# Patient Record
Sex: Female | Born: 1955 | Race: White | Hispanic: No | Marital: Married | Smoking: Never smoker
Health system: Southern US, Community
[De-identification: ages and names within clinical notes are randomized; demographics above are authoritative.]

## PROBLEM LIST (undated history)

## (undated) DIAGNOSIS — Z8489 Family history of other specified conditions: Secondary | ICD-10-CM

## (undated) DIAGNOSIS — Z923 Personal history of irradiation: Secondary | ICD-10-CM

## (undated) DIAGNOSIS — C50919 Malignant neoplasm of unspecified site of unspecified female breast: Secondary | ICD-10-CM

## (undated) DIAGNOSIS — M1990 Unspecified osteoarthritis, unspecified site: Secondary | ICD-10-CM

## (undated) DIAGNOSIS — C439 Malignant melanoma of skin, unspecified: Secondary | ICD-10-CM

## (undated) DIAGNOSIS — E7800 Pure hypercholesterolemia, unspecified: Secondary | ICD-10-CM

## (undated) DIAGNOSIS — Z1371 Encounter for nonprocreative screening for genetic disease carrier status: Secondary | ICD-10-CM

## (undated) HISTORY — PX: BUNIONECTOMY: SHX129

## (undated) HISTORY — PX: LEG SKIN LESION  BIOPSY / EXCISION: SUR473

## (undated) HISTORY — DX: Encounter for nonprocreative screening for genetic disease carrier status: Z13.71

## (undated) HISTORY — PX: BACK SURGERY: SHX140

## (undated) HISTORY — DX: Malignant neoplasm of unspecified site of unspecified female breast: C50.919

---

## 1988-12-02 HISTORY — PX: ABDOMINAL HYSTERECTOMY: SHX81

## 2001-04-15 ENCOUNTER — Encounter: Payer: Self-pay | Admitting: Otolaryngology

## 2001-04-15 ENCOUNTER — Encounter: Admission: RE | Admit: 2001-04-15 | Discharge: 2001-04-15 | Payer: Self-pay | Admitting: Otolaryngology

## 2004-04-27 ENCOUNTER — Other Ambulatory Visit: Payer: Self-pay

## 2005-03-14 ENCOUNTER — Ambulatory Visit: Payer: Self-pay | Admitting: Internal Medicine

## 2006-04-10 ENCOUNTER — Ambulatory Visit: Payer: Self-pay | Admitting: Internal Medicine

## 2006-08-12 ENCOUNTER — Ambulatory Visit: Payer: Self-pay | Admitting: Specialist

## 2007-05-26 ENCOUNTER — Ambulatory Visit: Payer: Self-pay | Admitting: Internal Medicine

## 2007-07-03 ENCOUNTER — Ambulatory Visit: Payer: Self-pay | Admitting: Gastroenterology

## 2008-10-05 ENCOUNTER — Ambulatory Visit: Payer: Self-pay | Admitting: Internal Medicine

## 2009-11-15 ENCOUNTER — Ambulatory Visit: Payer: Self-pay | Admitting: Internal Medicine

## 2010-07-03 ENCOUNTER — Observation Stay: Payer: Self-pay | Admitting: Internal Medicine

## 2010-11-20 ENCOUNTER — Ambulatory Visit: Payer: Self-pay | Admitting: Internal Medicine

## 2010-12-02 HISTORY — PX: COLONOSCOPY: SHX174

## 2011-02-15 ENCOUNTER — Ambulatory Visit: Payer: Self-pay | Admitting: Internal Medicine

## 2012-02-20 ENCOUNTER — Ambulatory Visit: Payer: Self-pay | Admitting: Internal Medicine

## 2012-09-21 ENCOUNTER — Emergency Department: Payer: Self-pay | Admitting: Emergency Medicine

## 2012-09-21 LAB — COMPREHENSIVE METABOLIC PANEL
Albumin: 4.3 g/dL (ref 3.4–5.0)
Alkaline Phosphatase: 84 U/L (ref 50–136)
Anion Gap: 6 — ABNORMAL LOW (ref 7–16)
BUN: 17 mg/dL (ref 7–18)
Bilirubin,Total: 0.3 mg/dL (ref 0.2–1.0)
Calcium, Total: 8.8 mg/dL (ref 8.5–10.1)
Chloride: 106 mmol/L (ref 98–107)
Co2: 28 mmol/L (ref 21–32)
Creatinine: 0.73 mg/dL (ref 0.60–1.30)
EGFR (African American): >60
EGFR (Non-African Amer.): >60
Glucose: 85 mg/dL (ref 65–99)
Osmolality: 280 (ref 275–301)
Potassium: 3.7 mmol/L (ref 3.5–5.1)
SGOT(AST): 28 U/L (ref 15–37)
SGPT (ALT): 25 U/L (ref 12–78)
Sodium: 140 mmol/L (ref 136–145)
Total Protein: 7.4 g/dL (ref 6.4–8.2)

## 2012-09-21 LAB — CBC
HCT: 38.7 % (ref 35.0–47.0)
HGB: 13.2 g/dL (ref 12.0–16.0)
MCH: 29.8 pg (ref 26.0–34.0)
MCHC: 34.1 g/dL (ref 32.0–36.0)
MCV: 88 fL (ref 80–100)
Platelet: 296 10*3/uL (ref 150–440)
RBC: 4.43 10*6/uL (ref 3.80–5.20)
RDW: 13.4 % (ref 11.5–14.5)
WBC: 6.2 10*3/uL (ref 3.6–11.0)

## 2012-09-21 LAB — CK TOTAL AND CKMB (NOT AT ARMC)
CK, Total: 80 U/L (ref 21–215)
CK-MB: 0.9 ng/mL (ref 0.5–3.6)

## 2012-09-21 LAB — TROPONIN I: Troponin-I: <0.02 ng/mL

## 2012-09-21 LAB — PRO B NATRIURETIC PEPTIDE: B-Type Natriuretic Peptide: 135 pg/mL — ABNORMAL HIGH (ref 0–125)

## 2013-02-23 ENCOUNTER — Ambulatory Visit: Payer: Self-pay | Admitting: Internal Medicine

## 2013-05-24 ENCOUNTER — Ambulatory Visit: Payer: Self-pay | Admitting: Internal Medicine

## 2013-05-25 ENCOUNTER — Ambulatory Visit: Payer: Self-pay | Admitting: Internal Medicine

## 2014-02-28 ENCOUNTER — Ambulatory Visit: Payer: Self-pay | Admitting: Internal Medicine

## 2014-03-16 ENCOUNTER — Ambulatory Visit: Payer: Self-pay | Admitting: Orthopedic Surgery

## 2014-04-14 DIAGNOSIS — R509 Fever, unspecified: Secondary | ICD-10-CM | POA: Insufficient documentation

## 2014-05-31 ENCOUNTER — Ambulatory Visit: Payer: Self-pay | Admitting: Internal Medicine

## 2014-07-06 DIAGNOSIS — E785 Hyperlipidemia, unspecified: Secondary | ICD-10-CM | POA: Insufficient documentation

## 2014-07-06 DIAGNOSIS — N83209 Unspecified ovarian cyst, unspecified side: Secondary | ICD-10-CM | POA: Insufficient documentation

## 2014-07-12 ENCOUNTER — Ambulatory Visit: Payer: Self-pay | Admitting: Internal Medicine

## 2014-12-02 DIAGNOSIS — C50919 Malignant neoplasm of unspecified site of unspecified female breast: Secondary | ICD-10-CM

## 2014-12-02 DIAGNOSIS — Z1371 Encounter for nonprocreative screening for genetic disease carrier status: Secondary | ICD-10-CM

## 2014-12-02 HISTORY — DX: Encounter for nonprocreative screening for genetic disease carrier status: Z13.71

## 2014-12-02 HISTORY — DX: Malignant neoplasm of unspecified site of unspecified female breast: C50.919

## 2015-04-12 ENCOUNTER — Other Ambulatory Visit: Payer: Self-pay | Admitting: Internal Medicine

## 2015-04-12 DIAGNOSIS — Z1231 Encounter for screening mammogram for malignant neoplasm of breast: Secondary | ICD-10-CM

## 2015-06-06 ENCOUNTER — Ambulatory Visit
Admission: RE | Admit: 2015-06-06 | Discharge: 2015-06-06 | Disposition: A | Discharge status: Home / Self Care | Payer: 59 | Source: Ambulatory Visit | Attending: Internal Medicine | Admitting: Internal Medicine

## 2015-06-06 DIAGNOSIS — Z1231 Encounter for screening mammogram for malignant neoplasm of breast: Secondary | ICD-10-CM | POA: Diagnosis not present

## 2015-06-08 ENCOUNTER — Other Ambulatory Visit: Payer: Self-pay | Admitting: Internal Medicine

## 2015-06-08 DIAGNOSIS — R928 Other abnormal and inconclusive findings on diagnostic imaging of breast: Secondary | ICD-10-CM

## 2015-06-13 ENCOUNTER — Ambulatory Visit
Admission: RE | Admit: 2015-06-13 | Discharge: 2015-06-13 | Disposition: A | Discharge status: Home / Self Care | Payer: 59 | Source: Ambulatory Visit | Attending: Internal Medicine | Admitting: Internal Medicine

## 2015-06-13 DIAGNOSIS — R928 Other abnormal and inconclusive findings on diagnostic imaging of breast: Secondary | ICD-10-CM

## 2015-06-13 DIAGNOSIS — N6489 Other specified disorders of breast: Secondary | ICD-10-CM | POA: Diagnosis present

## 2015-06-13 DIAGNOSIS — N63 Unspecified lump in breast: Secondary | ICD-10-CM | POA: Insufficient documentation

## 2015-06-14 ENCOUNTER — Ambulatory Visit: Payer: 59

## 2015-06-14 ENCOUNTER — Other Ambulatory Visit: Payer: Self-pay | Admitting: Internal Medicine

## 2015-06-14 DIAGNOSIS — N63 Unspecified lump in unspecified breast: Secondary | ICD-10-CM

## 2015-06-14 DIAGNOSIS — R928 Other abnormal and inconclusive findings on diagnostic imaging of breast: Secondary | ICD-10-CM

## 2015-06-16 ENCOUNTER — Other Ambulatory Visit: Payer: Self-pay | Admitting: Internal Medicine

## 2015-06-16 ENCOUNTER — Ambulatory Visit
Admission: RE | Admit: 2015-06-16 | Discharge: 2015-06-16 | Disposition: A | Discharge status: Home / Self Care | Payer: 59 | Source: Ambulatory Visit | Attending: Internal Medicine | Admitting: Internal Medicine

## 2015-06-16 DIAGNOSIS — N63 Unspecified lump in unspecified breast: Secondary | ICD-10-CM

## 2015-06-16 DIAGNOSIS — N6092 Unspecified benign mammary dysplasia of left breast: Secondary | ICD-10-CM | POA: Diagnosis not present

## 2015-06-16 DIAGNOSIS — R928 Other abnormal and inconclusive findings on diagnostic imaging of breast: Secondary | ICD-10-CM

## 2015-06-16 DIAGNOSIS — D0502 Lobular carcinoma in situ of left breast: Secondary | ICD-10-CM | POA: Insufficient documentation

## 2015-06-16 DIAGNOSIS — D242 Benign neoplasm of left breast: Secondary | ICD-10-CM | POA: Insufficient documentation

## 2015-06-16 HISTORY — PX: BREAST BIOPSY: SHX20

## 2015-06-20 ENCOUNTER — Ambulatory Visit (INDEPENDENT_AMBULATORY_CARE_PROVIDER_SITE_OTHER): Payer: 59 | Admitting: General Surgery

## 2015-06-20 ENCOUNTER — Ambulatory Visit: Payer: Self-pay | Admitting: General Surgery

## 2015-06-20 ENCOUNTER — Encounter: Payer: Self-pay | Admitting: General Surgery

## 2015-06-20 VITALS — BP 116/68 | HR 80 | Resp 12 | Ht 60.0 in | Wt 121.0 lb

## 2015-06-20 DIAGNOSIS — C50912 Malignant neoplasm of unspecified site of left female breast: Secondary | ICD-10-CM

## 2015-06-20 DIAGNOSIS — N62 Hypertrophy of breast: Secondary | ICD-10-CM | POA: Diagnosis not present

## 2015-06-20 DIAGNOSIS — N6092 Unspecified benign mammary dysplasia of left breast: Secondary | ICD-10-CM

## 2015-06-20 LAB — SURGICAL PATHOLOGY

## 2015-06-20 NOTE — Progress Notes (Addendum)
Patient ID: Lauren Mayo, female   DOB: 12/18/1955, 59 y.o.   MRN: [id number]  Chief Complaint  Patient presents with  . Other    breast cancer    HPI Lauren Mayo is a 59 y.o. female who presents for a breast evaluation. The most recent mammogram was done on 06/06/15 and added views 06/13/15 and left breast biopsy on 06/16/15 .  Patient does not perform regular self breast checks and gets regular mammograms done.    HPI  Past Medical History  Diagnosis Date  . Cancer     melaoma   . Cancer of breast 2016    INVASIVE LOBULAR CARCINOMA    Past Surgical History  Procedure Laterality Date  . Abdominal hysterectomy  1990  . Breast biopsy Left     benign  . Breast biopsy Left 06/16/2015    pending results  . Leg skin lesion  biopsy / excision Left 2012,2015    melanoma  . Colonoscopy  2012    [facility]    Family History  Problem Relation Age of Onset  . Breast cancer Sister 7    2016  . Breast cancer Paternal Aunt 62  . Hypertension Mother   . Diabetes Father     Social History History  Substance Use Topics  . Smoking status: Never Smoker   . Smokeless tobacco: Not on file  . Alcohol Use: 0.0 oz/week    0 Standard drinks or equivalent per week    No Known Allergies  Current Outpatient Prescriptions  Medication Sig Dispense Refill  . ibuprofen (ADVIL,MOTRIN) 200 MG tablet Take 200 mg by mouth every 6 (six) hours as needed.    . Vitamin D, Ergocalciferol, (DRISDOL) 50000 UNITS CAPS capsule Take by mouth.     No current facility-administered medications for this visit.    Review of Systems Review of Systems  Constitutional: Negative.   Respiratory: Negative.   Cardiovascular: Negative.     Blood pressure 116/68, pulse 80, resp. rate 12, height 5' (1.524 m), weight 121 lb (54.885 kg).  Physical Exam Physical Exam  Constitutional: She is oriented to person, place, and time. She appears well-developed and well-nourished.  HENT:  Mouth/Throat:  Oropharynx is clear and moist. No oropharyngeal exudate.  Eyes: Conjunctivae are normal. No scleral icterus.  Neck: Neck supple.  Cardiovascular: Normal rate, regular rhythm and normal heart sounds.   Pulmonary/Chest: Effort normal and breath sounds normal. Right breast exhibits no inverted nipple, no mass, no nipple discharge, no skin change and no tenderness. Left breast exhibits mass ( ). Left breast exhibits no inverted nipple, no nipple discharge, no skin change and no tenderness.  Left  Breast bruisng at   Lymphadenopathy:    She has no cervical adenopathy.  Neurological: She is alert and oriented to person, place, and time.  Skin: Skin is warm and dry.    Data Reviewed Mammograms from 2015-2016 as well as associated ultrasounds were reviewed. New spiculated density identified on screening mammogram, persisted on focal spot compression imaging. Ultrasound showed an abnormality with subsequent identification of invasive lobular carcinoma at the 7:00 position. Fibroadenoma with atypical lobular hyperplasia at the 11:00 position.  Assessment    Invasive lobular carcinoma of the left breast.  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    Plan    We spent the better part of an hour reviewing treatment options. An MRIs felt indicated to help assess if additional areas of LCIS/invasive lobular carcinoma not identified  on mammography are present, especially in the contralateral breast. The patient is a of modest breast volume, "A" cup, may be a small "B" cup. Breast conservation as possible with no more extensive disease than that identified on the mammogram/ultrasound are present in the 7:00 position, and if no invasive diseases notified in the 11:00 position of the left breast.  Mastectomy and breast conservation were presented as equivalent procedures. The availability of breast reconstruction should mastectomy be needed or desired was reviewed. The  patient will consider whether she would like to me with the plastic surgeon prior to making a decision on management of the breast itself.  Hormone receptor status is pending at this time, and it's unlikely that this would be a HER-2 positive tumor and even if so she's well below the 1 cm cutoff for neo-adjuvant treatment with Herceptin.   The opportunity to meet with medical oncology prior to surgical intervention was discussed, but is unlikely to contribute to her surgical treatment decision and will be put on hold at this time. An informational brochure was provided.  The patient reports her slightly older sister had a 2 month window between her diagnosis and actual surgical management by wide excision and axillary biopsy. It was emphasized that the patient has plenty of time to get the information she needs to make a good decision, but once she decides on a treatment plan (not including the reconstruction)  surgery can be scheduled within a week.  The role of genetic testing in light of her family history for the possibility of BRCA 1/2 defects was discussed. The patient will consider whether she would like to proceed with genetic testing and return for sample collection with the nurse if desired. Testing would be appropriate in my view in light of her family history, and potential benefit of elective BSO if positive.    Patient to be scheduled to have a bilateral  MRI with / without.The patient was encouraged to call with any questions. She'll notify the office if she would like to meet with plastic surgery.   [name] 06/20/2015, 8:39 PM

## 2015-06-20 NOTE — Patient Instructions (Signed)
Patient to be scheduled to have a MRI.

## 2015-06-21 ENCOUNTER — Telehealth: Payer: Self-pay | Admitting: *Deleted

## 2015-06-21 ENCOUNTER — Encounter: Payer: Self-pay | Admitting: *Deleted

## 2015-06-21 NOTE — Telephone Encounter (Signed)
Patient wants to talk to you about genetic testing

## 2015-06-21 NOTE — Telephone Encounter (Signed)
Genetic testing scheduled.

## 2015-06-22 ENCOUNTER — Ambulatory Visit (INDEPENDENT_AMBULATORY_CARE_PROVIDER_SITE_OTHER): Payer: 59 | Admitting: *Deleted

## 2015-06-22 DIAGNOSIS — C50912 Malignant neoplasm of unspecified site of left female breast: Secondary | ICD-10-CM

## 2015-06-22 NOTE — Progress Notes (Signed)
Genetic testing completed, aware it may take 2 weeks. Her MRI breast is scheduled for next week 06-28-15.

## 2015-06-22 NOTE — Patient Instructions (Signed)
The patient is aware to call back for any questions or concerns.  

## 2015-06-26 ENCOUNTER — Other Ambulatory Visit: Payer: 59

## 2015-06-28 ENCOUNTER — Ambulatory Visit
Admission: RE | Admit: 2015-06-28 | Discharge: 2015-06-28 | Disposition: A | Discharge status: Home / Self Care | Payer: 59 | Source: Ambulatory Visit | Attending: General Surgery | Admitting: General Surgery

## 2015-06-28 DIAGNOSIS — C50912 Malignant neoplasm of unspecified site of left female breast: Secondary | ICD-10-CM

## 2015-06-28 MED ORDER — GADOBENATE DIMEGLUMINE 529 MG/ML IV SOLN
10.0000 mL | Freq: Once | INTRAVENOUS | Status: AC | PRN
  Administered 2015-06-28: 10 mL via INTRAVENOUS

## 2015-06-29 ENCOUNTER — Telehealth: Payer: Self-pay | Admitting: General Surgery

## 2015-06-29 NOTE — Telephone Encounter (Signed)
I reviewed the MRI results with the patient. The right breast is under remarkable. There is the known foci of invasive lobular carcinoma on the left and no increased activity in the area of ADH, also on the left.  The patient brought up the idea of bilateral mastectomy. The recent [facility] breast surgeon consensus statement is 13 no indication for prophylactic mastectomy.  Options for management include breast conservation or mastectomy with immediate reconstruction. At this time the patient is looking to avoid mastectomy and desires to proceed with lumpectomy/axillary dissection. She is aware that we will need to remove some additional tissue from the area of ADH.  Looking to schedule this the second week of August to accommodate her upcoming [place] trip. We'll arrange for an office visit prior to review these details.

## 2015-06-30 ENCOUNTER — Encounter: Payer: Self-pay | Admitting: General Surgery

## 2015-06-30 ENCOUNTER — Other Ambulatory Visit: Payer: Self-pay

## 2015-06-30 DIAGNOSIS — C50912 Malignant neoplasm of unspecified site of left female breast: Secondary | ICD-10-CM

## 2015-07-05 ENCOUNTER — Encounter: Payer: Self-pay | Admitting: *Deleted

## 2015-07-05 ENCOUNTER — Other Ambulatory Visit: Payer: Self-pay | Admitting: General Surgery

## 2015-07-05 ENCOUNTER — Inpatient Hospital Stay: Admission: RE | Admit: 2015-07-05 | Payer: 59 | Source: Ambulatory Visit

## 2015-07-05 ENCOUNTER — Ambulatory Visit (INDEPENDENT_AMBULATORY_CARE_PROVIDER_SITE_OTHER): Payer: 59 | Admitting: General Surgery

## 2015-07-05 ENCOUNTER — Encounter: Payer: Self-pay | Admitting: General Surgery

## 2015-07-05 ENCOUNTER — Ambulatory Visit: Payer: 59

## 2015-07-05 VITALS — BP 100/58 | HR 74 | Resp 12 | Ht 60.0 in | Wt 120.0 lb

## 2015-07-05 DIAGNOSIS — C50912 Malignant neoplasm of unspecified site of left female breast: Secondary | ICD-10-CM

## 2015-07-05 DIAGNOSIS — N62 Hypertrophy of breast: Secondary | ICD-10-CM | POA: Diagnosis not present

## 2015-07-05 DIAGNOSIS — N6092 Unspecified benign mammary dysplasia of left breast: Secondary | ICD-10-CM

## 2015-07-05 NOTE — H&P (Signed)
Patient ID: Lauren Mayo, female DOB: 16-Oct-1956, 59 y.o. MRN: [id number]  Chief Complaint  Patient presents with  . Pre-op Exam    left breast wide excision    HPI Lauren Mayo is a 59 y.o. female here today for her pre op left breast wide excision scheduled for 07/12/15 for invasive lobular carcinoma. MRI breast was done on 06-28-15. Genetic testing has been completed, results pending.  HPI  Past Medical History  Diagnosis Date  . Cancer     melaoma   . Cancer of breast 2016    INVASIVE LOBULAR CARCINOMA    Past Surgical History  Procedure Laterality Date  . Abdominal hysterectomy  1990  . Leg skin lesion biopsy / excision Left 2012,2015    melanoma  . Colonoscopy  2012    [facility]  . Breast biopsy Left     benign  . Breast biopsy Left 06/16/2015    INVASIVE LOBULAR CARCINOMA    Family History  Problem Relation Age of Onset  . Breast cancer Sister 62    2016  . Breast cancer Paternal Aunt 69  . Hypertension Mother   . Diabetes Father   . Cancer Maternal Aunt 63    ovarian    Social History History  Substance Use Topics  . Smoking status: Never Smoker   . Smokeless tobacco: Not on file  . Alcohol Use: 0.0 oz/week    0 Standard drinks or equivalent per week    No Known Allergies  Current Outpatient Prescriptions  Medication Sig Dispense Refill  . ibuprofen (ADVIL,MOTRIN) 200 MG tablet Take 200 mg by mouth every 6 (six) hours as needed.    . Vitamin D, Ergocalciferol, (DRISDOL) 50000 UNITS CAPS capsule Take by mouth.     No current facility-administered medications for this visit.    Review of Systems Review of Systems  Constitutional: Negative.  Respiratory: Negative.  Cardiovascular: Negative.    Blood pressure 100/58, pulse 74, resp. rate 12, height 5' (1.524 m), weight 120 lb (54.432 kg).  Physical  Exam Physical Exam  Constitutional: She is oriented to person, place, and time. She appears well-developed and well-nourished.  HENT:  Mouth/Throat: Oropharynx is clear and moist.  Eyes: Conjunctivae are normal. No scleral icterus.  Neck: Neck supple.  Cardiovascular: Normal rate, regular rhythm and normal heart sounds.  Pulmonary/Chest: Effort normal and breath sounds normal.    Lymphadenopathy:   She has no cervical adenopathy.  Neurological: She is alert and oriented to person, place, and time.  Skin: Skin is warm and dry.  Psychiatric: She has a normal mood and affect.    Data Reviewed Ultrasound examination of the left breast was undertaken to confirm both lesion would be visible for wide excision.  In the 8:00 position (arm over the shoulder), 4 cm from the nipple the area of previous biopsy measuring 0.4 x 0.5 x 0.6 cm is identified.  At the 11:00 position, 2 cm from the nipple the previous biopsy site measuring up to 0.7 cm in diameter is visible. BI-RADS-6.  Previous MRI has been reviewed. Only the area of known invasive lobular carcinoma shows increased activity.    Assessment    Invasive lobular carcinoma of the lower inner quadrant of the left breast. 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  Desire for breast conservation.    Plan    Options for management of cancer were reviewed including mastectomy with or without reconstruction as well as partial mastectomy followed by radiation therapy. The  possibility of positive margins and the need for reexcision were reviewed.  Genetic testing is pending but would not change the patient's option for breast conservation which is her present desire.  Plans are to proceed with wide excision and sentinel node biopsy on Wednesday, 07/12/2015.     PCP: Teena Irani 07/05/2015, 9:25 AM

## 2015-07-05 NOTE — Patient Instructions (Signed)
The patient is aware to call back for any questions or concerns.  

## 2015-07-05 NOTE — Patient Instructions (Signed)
  Your procedure is scheduled on: 07-12-15 Report to [facility] (2ND DESK ON RIGHT)   Remember: Instructions that are not followed completely may result in serious medical risk, up to and including death, or upon the discretion of your surgeon and anesthesiologist your surgery may need to be rescheduled.    _X___ 1. Do not eat food or drink liquids after midnight. No gum chewing or hard candies.     _X___ 2. No Alcohol for 24 hours before or after surgery.   ____ 3. Bring all medications with you on the day of surgery if instructed.    ____ 4. Notify your doctor if there is any change in your medical condition     (cold, fever, infections).     Do not wear jewelry, make-up, hairpins, clips or nail polish.  Do not wear lotions, powders, or perfumes. You may wear deodorant.  Do not shave 48 hours prior to surgery. Men may shave face and neck.  Do not bring valuables to the hospital.    [facility] is not responsible for any belongings or valuables.               Contacts, dentures or bridgework may not be worn into surgery.  Leave your suitcase in the car. After surgery it may be brought to your room.  For patients admitted to the hospital, discharge time is determined by your   treatment team.   Patients discharged the day of surgery will not be allowed to drive home.   Please read over the following fact sheets that you were given:     ____ Take these medicines the morning of surgery with A SIP OF WATER:    1. NONE  2.   3.   4.  5.  6.  ____ Fleet Enema (as directed)   ____ Use CHG Soap as directed  ____ Use inhalers on the day of surgery  ____ Stop metformin 2 days prior to surgery    ____ Take 1/2 of usual insulin dose the night before surgery and none on the morning of surgery.   __X__ Stop Coumadin/Plavix/aspirin-STOP ASA NOW  __X__ Stop Anti-inflammatories-STOP IBUPROFEN NOW-NO NSAIDS OR ASA PRODUCTS-TYLENOL OK   ____ Stop supplements until  after surgery.    ____ Bring C-Pap to the hospital.

## 2015-07-05 NOTE — Progress Notes (Signed)
Patient ID: Lauren Mayo, female   DOB: 1956-10-31, 59 y.o.   MRN: [id number]  Chief Complaint  Patient presents with  . Pre-op Exam    left breast wide excision    HPI Lauren Mayo is a 59 y.o. female here today for her pre op left breast wide excision scheduled for 07/12/15 for invasive lobular carcinoma. MRI breast was done on 06-28-15. Genetic testing has been completed, results pending.   HPI  Past Medical History  Diagnosis Date  . Cancer     melaoma   . Cancer of breast 2016    INVASIVE LOBULAR CARCINOMA    Past Surgical History  Procedure Laterality Date  . Abdominal hysterectomy  1990  . Leg skin lesion  biopsy / excision Left 2012,2015    melanoma  . Colonoscopy  2012    [facility]  . Breast biopsy Left     benign  . Breast biopsy Left 06/16/2015    INVASIVE LOBULAR CARCINOMA    Family History  Problem Relation Age of Onset  . Breast cancer Sister 83    2016  . Breast cancer Paternal Aunt 17  . Hypertension Mother   . Diabetes Father   . Cancer Maternal Aunt 63    ovarian    Social History History  Substance Use Topics  . Smoking status: Never Smoker   . Smokeless tobacco: Not on file  . Alcohol Use: 0.0 oz/week    0 Standard drinks or equivalent per week    No Known Allergies  Current Outpatient Prescriptions  Medication Sig Dispense Refill  . ibuprofen (ADVIL,MOTRIN) 200 MG tablet Take 200 mg by mouth every 6 (six) hours as needed.    . Vitamin D, Ergocalciferol, (DRISDOL) 50000 UNITS CAPS capsule Take by mouth.     No current facility-administered medications for this visit.    Review of Systems Review of Systems  Constitutional: Negative.   Respiratory: Negative.   Cardiovascular: Negative.     Blood pressure 100/58, pulse 74, resp. rate 12, height 5' (1.524 m), weight 120 lb (54.432 kg).  Physical Exam Physical Exam  Constitutional: She is oriented to person, place, and time. She appears well-developed and well-nourished.   HENT:  Mouth/Throat: Oropharynx is clear and moist.  Eyes: Conjunctivae are normal. No scleral icterus.  Neck: Neck supple.  Cardiovascular: Normal rate, regular rhythm and normal heart sounds.   Pulmonary/Chest: Effort normal and breath sounds normal.    Lymphadenopathy:    She has no cervical adenopathy.  Neurological: She is alert and oriented to person, place, and time.  Skin: Skin is warm and dry.  Psychiatric: She has a normal mood and affect.    Data Reviewed Ultrasound examination of the left breast was undertaken to confirm both lesion would be visible for wide excision.  In the 8:00 position (arm over the shoulder), 4 cm from the nipple the area of previous biopsy measuring 0.4 x 0.5 x 0.6 cm is identified.  At the 11:00 position, 2 cm from the nipple the previous biopsy site measuring up to 0.7 cm in diameter is visible. BI-RADS-6.  Previous MRI has been reviewed. Only the area of known invasive lobular carcinoma shows increased activity.    Assessment    Invasive lobular carcinoma of the lower inner quadrant of the left breast. 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  Desire for breast conservation.    Plan    Options for management of cancer were reviewed including mastectomy with or without  reconstruction as well as partial mastectomy followed by radiation therapy. The possibility of positive margins and the need for reexcision were reviewed.  Genetic testing is pending but would not change the patient's option for breast conservation which is her present desire.  Plans are to proceed with wide excision and sentinel node biopsy on Wednesday, 07/12/2015.     PCP:  Teena Irani 07/05/2015, 9:25 AM

## 2015-07-10 ENCOUNTER — Telehealth: Payer: Self-pay | Admitting: *Deleted

## 2015-07-10 NOTE — Telephone Encounter (Signed)
Patient is calling wanting BRCA results.

## 2015-07-11 ENCOUNTER — Encounter: Payer: Self-pay | Admitting: *Deleted

## 2015-07-11 NOTE — Telephone Encounter (Signed)
Notified of BRCA negative results, pt pleased. Surgery is for tomorrow.

## 2015-07-12 ENCOUNTER — Ambulatory Visit: Payer: 59 | Admitting: Certified Registered Nurse Anesthetist

## 2015-07-12 ENCOUNTER — Ambulatory Visit
Admission: RE | Admit: 2015-07-12 | Discharge: 2015-07-12 | Disposition: A | Discharge status: Home / Self Care | Payer: 59 | Source: Ambulatory Visit | Attending: General Surgery | Admitting: General Surgery

## 2015-07-12 ENCOUNTER — Ambulatory Visit: Payer: 59

## 2015-07-12 ENCOUNTER — Encounter
Admission: RE | Disposition: A | Discharge status: Home / Self Care | Payer: Self-pay | Source: Ambulatory Visit | Attending: General Surgery

## 2015-07-12 DIAGNOSIS — D0502 Lobular carcinoma in situ of left breast: Secondary | ICD-10-CM | POA: Insufficient documentation

## 2015-07-12 DIAGNOSIS — Z833 Family history of diabetes mellitus: Secondary | ICD-10-CM | POA: Diagnosis not present

## 2015-07-12 DIAGNOSIS — C50912 Malignant neoplasm of unspecified site of left female breast: Secondary | ICD-10-CM

## 2015-07-12 DIAGNOSIS — D242 Benign neoplasm of left breast: Secondary | ICD-10-CM | POA: Insufficient documentation

## 2015-07-12 DIAGNOSIS — N63 Unspecified lump in unspecified breast: Secondary | ICD-10-CM

## 2015-07-12 DIAGNOSIS — Z8041 Family history of malignant neoplasm of ovary: Secondary | ICD-10-CM | POA: Diagnosis not present

## 2015-07-12 DIAGNOSIS — Z803 Family history of malignant neoplasm of breast: Secondary | ICD-10-CM | POA: Diagnosis not present

## 2015-07-12 DIAGNOSIS — Z9889 Other specified postprocedural states: Secondary | ICD-10-CM | POA: Insufficient documentation

## 2015-07-12 DIAGNOSIS — M1990 Unspecified osteoarthritis, unspecified site: Secondary | ICD-10-CM | POA: Diagnosis not present

## 2015-07-12 DIAGNOSIS — Z85820 Personal history of malignant melanoma of skin: Secondary | ICD-10-CM | POA: Insufficient documentation

## 2015-07-12 DIAGNOSIS — Z8249 Family history of ischemic heart disease and other diseases of the circulatory system: Secondary | ICD-10-CM | POA: Insufficient documentation

## 2015-07-12 DIAGNOSIS — Z90710 Acquired absence of both cervix and uterus: Secondary | ICD-10-CM | POA: Insufficient documentation

## 2015-07-12 DIAGNOSIS — C50312 Malignant neoplasm of lower-inner quadrant of left female breast: Secondary | ICD-10-CM | POA: Diagnosis not present

## 2015-07-12 DIAGNOSIS — Z791 Long term (current) use of non-steroidal anti-inflammatories (NSAID): Secondary | ICD-10-CM | POA: Diagnosis not present

## 2015-07-12 HISTORY — DX: Unspecified osteoarthritis, unspecified site: M19.90

## 2015-07-12 HISTORY — PX: BREAST LUMPECTOMY WITH SENTINEL LYMPH NODE BIOPSY: SHX5597

## 2015-07-12 HISTORY — PX: BREAST LUMPECTOMY: SHX2

## 2015-07-12 HISTORY — DX: Malignant melanoma of skin, unspecified: C43.9

## 2015-07-12 HISTORY — DX: Family history of other specified conditions: Z84.89

## 2015-07-12 HISTORY — DX: Pure hypercholesterolemia, unspecified: E78.00

## 2015-07-12 SURGERY — BREAST LUMPECTOMY WITH SENTINEL LYMPH NODE BX
Anesthesia: General | Laterality: Left | Wound class: Clean

## 2015-07-12 MED ORDER — LACTATED RINGERS IV SOLN
INTRAVENOUS | Status: DC
  Administered 2015-07-12: 15:00:00 via INTRAVENOUS

## 2015-07-12 MED ORDER — METHYLENE BLUE 1 % INJ SOLN
INTRAMUSCULAR | Status: AC
  Filled 2015-07-12: qty 10

## 2015-07-12 MED ORDER — METHYLENE BLUE 1 % INJ SOLN
INTRAMUSCULAR | Status: DC | PRN
  Administered 2015-07-12: 1 mL

## 2015-07-12 MED ORDER — HYDROCODONE-ACETAMINOPHEN 5-325 MG PO TABS: 1.0000 | ORAL_TABLET | ORAL | Status: DC | PRN

## 2015-07-12 MED ORDER — BUPIVACAINE-EPINEPHRINE (PF) 0.5% -1:200000 IJ SOLN
INTRAMUSCULAR | Status: AC
  Filled 2015-07-12: qty 30

## 2015-07-12 MED ORDER — ONDANSETRON HCL 4 MG/2ML IJ SOLN
INTRAMUSCULAR | Status: DC | PRN
  Administered 2015-07-12: 4 mg via INTRAVENOUS

## 2015-07-12 MED ORDER — LIDOCAINE HCL (CARDIAC) 20 MG/ML IV SOLN
INTRAVENOUS | Status: DC | PRN
  Administered 2015-07-12: 60 mg via INTRAVENOUS

## 2015-07-12 MED ORDER — TECHNETIUM TC 99M SULFUR COLLOID
0.9960 | Freq: Once | INTRAVENOUS | Status: DC | PRN
  Administered 2015-07-12: 0.996 via INTRAVENOUS
  Filled 2015-07-12: qty 1

## 2015-07-12 MED ORDER — FAMOTIDINE 20 MG PO TABS
ORAL_TABLET | ORAL | Status: AC
  Administered 2015-07-12: 20 mg via ORAL
  Filled 2015-07-12: qty 1

## 2015-07-12 MED ORDER — SODIUM CHLORIDE 0.9 % IJ SOLN
INTRAMUSCULAR | Status: AC
  Filled 2015-07-12: qty 10

## 2015-07-12 MED ORDER — BUPIVACAINE-EPINEPHRINE (PF) 0.5% -1:200000 IJ SOLN
INTRAMUSCULAR | Status: DC | PRN
  Administered 2015-07-12: 30 mL

## 2015-07-12 MED ORDER — EPHEDRINE SULFATE 50 MG/ML IJ SOLN
INTRAMUSCULAR | Status: DC | PRN
  Administered 2015-07-12: 10 mg via INTRAVENOUS

## 2015-07-12 MED ORDER — FENTANYL CITRATE (PF) 100 MCG/2ML IJ SOLN
INTRAMUSCULAR | Status: AC
  Administered 2015-07-12: 25 ug via INTRAVENOUS
  Filled 2015-07-12: qty 2

## 2015-07-12 MED ORDER — KETOROLAC TROMETHAMINE 30 MG/ML IJ SOLN
INTRAMUSCULAR | Status: DC | PRN
  Administered 2015-07-12: 30 mg via INTRAVENOUS

## 2015-07-12 MED ORDER — FENTANYL CITRATE (PF) 100 MCG/2ML IJ SOLN
INTRAMUSCULAR | Status: DC | PRN
  Administered 2015-07-12: 25 ug via INTRAVENOUS
  Administered 2015-07-12 (×3): 50 ug via INTRAVENOUS
  Administered 2015-07-12: 25 ug via INTRAVENOUS

## 2015-07-12 MED ORDER — FENTANYL CITRATE (PF) 100 MCG/2ML IJ SOLN
25.0000 ug | INTRAMUSCULAR | Status: DC | PRN
  Administered 2015-07-12 (×4): 25 ug via INTRAVENOUS

## 2015-07-12 MED ORDER — ONDANSETRON HCL 4 MG/2ML IJ SOLN: 4.0000 mg | Freq: Once | INTRAMUSCULAR | Status: DC | PRN

## 2015-07-12 MED ORDER — PROPOFOL 10 MG/ML IV BOLUS
INTRAVENOUS | Status: DC | PRN
  Administered 2015-07-12: 130 mg via INTRAVENOUS

## 2015-07-12 MED ORDER — FAMOTIDINE 20 MG PO TABS
20.0000 mg | ORAL_TABLET | Freq: Once | ORAL | Status: AC
  Administered 2015-07-12: 20 mg via ORAL

## 2015-07-12 MED ORDER — MIDAZOLAM HCL 2 MG/2ML IJ SOLN
INTRAMUSCULAR | Status: DC | PRN
  Administered 2015-07-12: 2 mg via INTRAVENOUS

## 2015-07-12 MED ORDER — DEXAMETHASONE SODIUM PHOSPHATE 4 MG/ML IJ SOLN
INTRAMUSCULAR | Status: DC | PRN
  Administered 2015-07-12: 10 mg via INTRAVENOUS

## 2015-07-12 SURGICAL SUPPLY — 50 items
BANDAGE ELASTIC 6 CLIP NS LF (GAUZE/BANDAGES/DRESSINGS) ×2 IMPLANT
BLADE SURG 15 STRL SS SAFETY (BLADE) ×4 IMPLANT
BNDG GAUZE 4.5X4.1 6PLY STRL (MISCELLANEOUS) ×2 IMPLANT
BULB RESERV EVAC DRAIN JP 100C (MISCELLANEOUS) IMPLANT
CANISTER SUCT 1200ML W/VALVE (MISCELLANEOUS) ×2 IMPLANT
CHLORAPREP W/TINT 26ML (MISCELLANEOUS) ×2 IMPLANT
CNTNR SPEC 2.5X3XGRAD LEK (MISCELLANEOUS) ×1
CONT SPEC 4OZ STER OR WHT (MISCELLANEOUS) ×1
CONT SPEC 4OZ STRL OR WHT (MISCELLANEOUS) ×1
CONTAINER SPEC 2.5X3XGRAD LEK (MISCELLANEOUS) ×1 IMPLANT
COVER PROBE FLX POLY STRL (MISCELLANEOUS) ×2 IMPLANT
DEVICE DUBIN SPECIMEN MAMMOGRA (MISCELLANEOUS) ×2 IMPLANT
DRAIN CHANNEL JP 15F RND 16 (MISCELLANEOUS) IMPLANT
DRAPE LAPAROTOMY TRNSV 106X77 (MISCELLANEOUS) ×2 IMPLANT
DRSG TELFA 3X8 NADH (GAUZE/BANDAGES/DRESSINGS) ×2 IMPLANT
ELECT CAUTERY BLADE TIP 2.5 (TIP) ×2
ELECTRODE CAUTERY BLDE TIP 2.5 (TIP) ×1 IMPLANT
GAUZE FLUFF 18X24 1PLY STRL (GAUZE/BANDAGES/DRESSINGS) ×2 IMPLANT
GAUZE SPONGE 4X4 12PLY STRL (GAUZE/BANDAGES/DRESSINGS) ×2 IMPLANT
GLOVE BIO SURGEON STRL SZ7.5 (GLOVE) ×4 IMPLANT
GLOVE INDICATOR 8.0 STRL GRN (GLOVE) ×4 IMPLANT
GOWN STRL REUS W/ TWL LRG LVL3 (GOWN DISPOSABLE) ×2 IMPLANT
GOWN STRL REUS W/TWL LRG LVL3 (GOWN DISPOSABLE) ×4
HARMONIC SCALPEL FOCUS (MISCELLANEOUS) IMPLANT
KIT RM TURNOVER STRD PROC AR (KITS) ×2 IMPLANT
LABEL OR SOLS (LABEL) ×2 IMPLANT
MARGIN MAP 10MM (MISCELLANEOUS) ×2 IMPLANT
NDL SAFETY 22GX1.5 (NEEDLE) ×2 IMPLANT
NDL SAFETY 25GX1.5 (NEEDLE) ×2 IMPLANT
PACK BASIN MINOR ARMC (MISCELLANEOUS) ×2 IMPLANT
PAD GROUND ADULT SPLIT (MISCELLANEOUS) ×2 IMPLANT
SHEARS FOC LG CVD HARMONIC 17C (MISCELLANEOUS) IMPLANT
SLEVE PROBE SENORX GAMMA FIND (MISCELLANEOUS) ×2 IMPLANT
STRIP CLOSURE SKIN 1/2X4 (GAUZE/BANDAGES/DRESSINGS) ×2 IMPLANT
SUT ETHILON 3-0 FS-10 30 BLK (SUTURE) ×2
SUT SILK 2 0 (SUTURE) ×2
SUT SILK 2-0 18XBRD TIE 12 (SUTURE) ×1 IMPLANT
SUT VIC AB 2-0 CT1 27 (SUTURE) ×4
SUT VIC AB 2-0 CT1 TAPERPNT 27 (SUTURE) ×2 IMPLANT
SUT VIC AB 3-0 SH 27 (SUTURE) ×4
SUT VIC AB 3-0 SH 27X BRD (SUTURE) ×2 IMPLANT
SUT VIC AB 4-0 FS2 27 (SUTURE) ×2 IMPLANT
SUT VICRYL+ 3-0 144IN (SUTURE) ×2 IMPLANT
SUTURE EHLN 3-0 FS-10 30 BLK (SUTURE) ×1 IMPLANT
SWABSTK COMLB BENZOIN TINCTURE (MISCELLANEOUS) ×2 IMPLANT
SYR BULB IRRIG 60ML STRL (SYRINGE) ×2 IMPLANT
SYR CONTROL 10ML (SYRINGE) ×2 IMPLANT
SYRINGE 10CC LL (SYRINGE) ×2 IMPLANT
TAPE TRANSPORE STRL 2 31045 (GAUZE/BANDAGES/DRESSINGS) IMPLANT
WATER STERILE IRR 1000ML POUR (IV SOLUTION) ×2 IMPLANT

## 2015-07-12 NOTE — Anesthesia Preprocedure Evaluation (Signed)
Anesthesia Evaluation  Patient identified by MRN, date of birth, ID band Patient awake    Reviewed: Allergy & Precautions, NPO status , Patient's Chart, lab work & pertinent test results  History of Anesthesia Complications (+) Family history of anesthesia reactionNegative for: history of anesthetic complications (sister with difficulty waking up)  Airway Mallampati: II  TM Distance: >3 FB Neck ROM: Full    Dental  (+) Teeth Intact   Pulmonary neg pulmonary ROS,          Cardiovascular     Neuro/Psych negative neurological ROS     GI/Hepatic   Endo/Other    Renal/GU      Musculoskeletal  (+) Arthritis -, Osteoarthritis,    Abdominal   Peds  Hematology   Anesthesia Other Findings   Reproductive/Obstetrics                             Anesthesia Physical Anesthesia Plan  ASA: II  Anesthesia Plan: General   Post-op Pain Management:    Induction: Intravenous  Airway Management Planned: LMA  Additional Equipment:   Intra-op Plan:   Post-operative Plan:   Informed Consent: I have reviewed the patients History and Physical, chart, labs and discussed the procedure including the risks, benefits and alternatives for the proposed anesthesia with the patient or authorized representative who has indicated his/her understanding and acceptance.     Plan Discussed with:   Anesthesia Plan Comments:         Anesthesia Quick Evaluation

## 2015-07-12 NOTE — Anesthesia Postprocedure Evaluation (Signed)
  Anesthesia Post-op Note  Patient: Lauren Mayo  Procedure(s) Performed: Procedure(s): BREAST WIDE EXCISION WITH SENTINEL LYMPH NODE BX, MASTOPLASTY  (Left)  Anesthesia type:General  Patient location: PACU  Post pain: Pain level controlled  Post assessment: Post-op Vital signs reviewed, Patient's Cardiovascular Status Stable, Respiratory Function Stable, Patent Airway and No signs of Nausea or vomiting  Post vital signs: Reviewed and stable  Last Vitals:  Filed Vitals:   07/12/15 1655  BP: 122/66  Pulse: 93  Temp:   Resp: 20    Level of consciousness: awake, alert  and patient cooperative  Complications: No apparent anesthesia complications

## 2015-07-12 NOTE — Transfer of Care (Signed)
Immediate Anesthesia Transfer of Care Note  Patient: Lauren Mayo  Procedure(s) Performed: Procedure(s): BREAST WIDE EXCISION WITH SENTINEL LYMPH NODE BX, MASTOPLASTY  (Left)  Patient Location: PACU  Anesthesia Type:General  Level of Consciousness: sedated, patient cooperative and responds to stimulation  Airway & Oxygen Therapy: Patient Spontanous Breathing and Patient connected to face mask oxygen  Post-op Assessment: Report given to RN and Post -op Vital signs reviewed and stable  Post vital signs: Reviewed and stable  Last Vitals:  Filed Vitals:   07/12/15 1640  BP: 110/55  Pulse: 72  Temp: 36.2 C  Resp: 19    Complications: No apparent anesthesia complications

## 2015-07-12 NOTE — H&P (Signed)
59 year old female identified with invasive lobular carcinoma. For wide local excision as well as excision of a small area of LCIS. Sentinel node biopsy is planned.  No change in health her cardiovascular status since last visit.

## 2015-07-12 NOTE — Anesthesia Procedure Notes (Signed)
Procedure Name: LMA Insertion Date/Time: 07/12/2015 3:02 PM Performed by: Jonna Clark Pre-anesthesia Checklist: Patient identified, Patient being monitored, Timeout performed, Emergency Drugs available and Suction available Patient Re-evaluated:Patient Re-evaluated prior to inductionOxygen Delivery Method: Circle system utilized Preoxygenation: Pre-oxygenation with 100% oxygen Intubation Type: IV induction Ventilation: Mask ventilation without difficulty LMA: LMA inserted LMA Size: 3.0 Tube type: Oral Number of attempts: 1 Placement Confirmation: positive ETCO2 and breath sounds checked- equal and bilateral Tube secured with: Tape Dental Injury: Teeth and Oropharynx as per pre-operative assessment

## 2015-07-12 NOTE — Discharge Instructions (Signed)
AMBULATORY SURGERY  °DISCHARGE INSTRUCTIONS ° ° °1) The drugs that you were given will stay in your system until tomorrow so for the next 24 hours you should not: ° °A) Drive an automobile °B) Make any legal decisions °C) Drink any alcoholic beverage ° ° °2) You may resume regular meals tomorrow.  Today it is better to start with liquids and gradually work up to solid foods. ° °You may eat anything you prefer, but it is better to start with liquids, then soup and crackers, and gradually work up to solid foods. ° ° °3) Please notify your doctor immediately if you have any unusual bleeding, trouble breathing, redness and pain at the surgery site, drainage, fever, or pain not relieved by medication. ° °Please contact your physician with any problems or Same Day Surgery at [phone], Monday through Friday 6 am to 4 pm, or [place] [facility] [place] [facility] number at [phone]. °

## 2015-07-12 NOTE — Op Note (Signed)
Preoperative diagnosis: Invasive lobular carcinoma the left breast, LCIS.  Postoperative diagnosis: Same.  Operative procedure: Wide excision and mastoplasty left lower inner quadrant, excision of left upper quadrant area of LCIS, sentinel node biopsy.  Operative surgeon: Ollen Bowl, M.D.  Anesthesia: Gen. by LMA, Marcaine 0.5% with 1-200,000 units of epinephrine: 30 mL  Estimated blood loss: Less than 10 mL.  Clinical note: This 59 year old woman was noted to have an abnormal mammogram and biopsy showed an area of invasive lobular carcinoma in the lower inner quadrant of the left breast. A fibroadenoma with focal LCIS was identified in the upper inner quadrant of the left breast. Preoperative MRI showed no additional lesions and no evidence of increased activity at the site of LCIS. She underwent injection with technetium sulfur colloid prior to today's procedure for planned sentinel node biopsy.  Operative note: With the patient under adequate general anesthesia 3 mL of saline/methylene blue diluted to 1 was injected in the subareolar plexus. The breast was then prepped with chlor prep and draped. Ultrasound was used to identify location sites of the 2 previous biopsies. Attention was turned to the lower inner quadrant were radial incision was made from the edge of the areola and extended not to the inframammary fold. The skin was incised sharply in the adipose tissue divided. The 2 x 2 x 2 cm block of tissue was excised down to and including the pectoralis fascia. The specimen was orientated and specimen radiograph confirmed the previously placed clip was in place. Pathology report showed the inferior margin to be close but negative.  Attention was turned towards the upper inner quadrant lesion which rested just above the pectoralis fascia. To avoid a second incision, ultrasound is used to direct a hemostat below the lesion. The area was then dissected free from the wide excision site  orientated and specimen radiograph confirmed the previously placed clip in place. This sample was sent for permanent section. While the pathology reports were pending attention was turned to the left axilla.  The gamma finder showed an area of increased uptake at the base of the left axilla. The area was infiltrated with Marcaine as had been the primary breast incisions for postoperative comfort. The skin was incised sharply and the remaining dissection completed with electrocautery. A lymphatic was followed to a blue hot lymph node. 2 additional non-blue, hot lymph node and one non-blue, non-hot non-sentinel node were removed and sent for permanent section. Preoperative discussion with pathology suggested that the likelihood of being able to detect isolated metastatic invasive lobular carcinoma in a field of lymphocytes was unlikely. As the primary tumor was very small axillary metastatic disease is felt to be unlikely.  Hemostasis was electrocautery and the axilla wound was closed in layers with interrupted 2-0 Vicryls figure-of-eight sutures. The skin was closed with a running 4-0 running subcuticular suture.  The breast was elevated off the underlying pectoralis fascia taking the fascia with the breast specimen circumferentially for approximately 3 cm. This allowed easy approximation and closure of the defect with interrupted 2-0 Vicryls figure-of-eight sutures in multiple layers. The area of the fibroadenoma/LCIS site of was not closed specifically as it was fairly deep and there was no overlying breast deformity. The skin was closed with a running 4-0 Vicryls running suture. Benzoin and Steri-Strips were applied to both wounds. Fluff gauze, Kerlix and an Ace wrap were applied. The patient tolerated the procedure well and was taken to recovery in stable condition.

## 2015-07-13 ENCOUNTER — Encounter: Payer: Self-pay | Admitting: General Surgery

## 2015-07-13 HISTORY — PX: BREAST SURGERY: SHX581

## 2015-07-14 ENCOUNTER — Telehealth: Payer: Self-pay | Admitting: General Surgery

## 2015-07-14 NOTE — Telephone Encounter (Signed)
Message left path report was fine.

## 2015-07-18 ENCOUNTER — Encounter: Payer: Self-pay | Admitting: General Surgery

## 2015-07-18 ENCOUNTER — Ambulatory Visit (INDEPENDENT_AMBULATORY_CARE_PROVIDER_SITE_OTHER): Payer: 59 | Admitting: General Surgery

## 2015-07-18 VITALS — BP 118/68 | HR 80 | Resp 14 | Ht 60.0 in | Wt 119.8 lb

## 2015-07-18 DIAGNOSIS — C50912 Malignant neoplasm of unspecified site of left female breast: Secondary | ICD-10-CM

## 2015-07-18 NOTE — Progress Notes (Signed)
Patient ID: Lauren Mayo, female   DOB: 1956-08-25, 59 y.o.   MRN: [id number]  Chief Complaint  Patient presents with  . Routine Post Op    left breast wide excision    HPI Lauren Mayo is a 59 y.o. female here for a post op visit for left breast wide excision and SLN biopsy done on 07/12/15. She states that she is doing well. She has only had to take prescriptin pain medications 3 times. She has had minimal discomfort with this. Her only complaint is some itching around the incision sites and where the bandages were. She reports that she has good energy in the morning and then gets some fatigue towards the afternoon.  HPI  Past Medical History  Diagnosis Date  . Arthritis     RIGHT LEG  . Family history of adverse reaction to anesthesia     SISTER-TROUBLE WAKING UP  . Hypercholesteremia   . BRCA negative 06-22-15    Dr Bary Castilla  . Cancer     melaoma   . Cancer of breast 2016    INVASIVE LOBULAR CARCINOMA, T1b,N0. ER positive, PR negative, HER-2/neu not overexpressed.  . Melanoma     Past Surgical History  Procedure Laterality Date  . Abdominal hysterectomy  1990  . Leg skin lesion  biopsy / excision Left 2012,2015    melanoma  . Colonoscopy  2012    [facility]  . Back surgery      LUMBAR-HERNIATED DISC  . Bunionectomy Bilateral   . Breast lumpectomy with sentinel lymph node biopsy Left 07/12/2015    Procedure: BREAST WIDE EXCISION WITH SENTINEL LYMPH NODE BX, MASTOPLASTY ;  Surgeon: [name], MD;  Location: [facility] ORS;  Service: General;  Laterality: Left;  . Breast biopsy Left     benign  . Breast biopsy Left 06/16/2015    INVASIVE LOBULAR CARCINOMA  . Breast surgery Left 07/13/2015     Wide excision, mastoplasty, sentinel node biopsy.    Family History  Problem Relation Age of Onset  . Breast cancer Sister 7    2016  . Breast cancer Paternal Aunt 27  . Hypertension Mother   . Diabetes Father   . Cancer Maternal Aunt 63    ovarian    Social  History Social History  Substance Use Topics  . Smoking status: Never Smoker   . Smokeless tobacco: Never Used  . Alcohol Use: 0.0 oz/week    0 Standard drinks or equivalent per week     Comment: OCC    No Known Allergies  Current Outpatient Prescriptions  Medication Sig Dispense Refill  . aspirin 81 MG tablet Take 81 mg by mouth daily as needed for pain.    Marland Kitchen ibuprofen (ADVIL,MOTRIN) 200 MG tablet Take 200 mg by mouth every 6 (six) hours as needed.    . Vitamin D, Ergocalciferol, (DRISDOL) 50000 UNITS CAPS capsule Take 50,000 Units by mouth every 7 (seven) days.      No current facility-administered medications for this visit.    Review of Systems Review of Systems  Constitutional: Negative.   Respiratory: Negative.   Cardiovascular: Negative.     Blood pressure 118/68, pulse 80, resp. rate 14, height 5' (1.524 m), weight 119 lb 12.8 oz (54.341 kg).  Physical Exam Physical Exam  Eyes: No scleral icterus.  Pulmonary/Chest:    Lymphadenopathy:    She has no cervical adenopathy.    Data Reviewed Pathology showed a 9 mm invasive lobular carcinoma. No upstaging at  the 11:00 site from LCIS/ADH noted on original biopsy. T1b, N0.  Assessment    Doing well status post wide excision and sentinel node biopsy.    Plan    The patient was offered the opportunity to meet with medical oncology for their recommendations regarding adjuvant therapy. Certainly antiestrogen therapy would be appropriate. She reports that her sister had additional testing (it sounds like Oncotype DX or Mammoprint) to confirm that she would not benefit from adjuvant chemotherapy. We'll get medical oncology opinion regarding the benefits of this is testing would be appropriate for the patient.  She'll me with radiation oncology for their assessment for whole breast radiation. She is not a candidate for accelerated partial breast radiation based on the identification of LCIS in the upper inner quadrant and  the invasive lobular carcinoma in the lower inner quadrant.  We'll plan for a follow-up examination in 2 weeks, should be after the consultation was noted above have been completed.  The patient will continue to wear her bra as well as the breast feels heavy. She may increase her activity as tolerated but was discouraged from repetitive activities involving the left upper extremity.    PCP: Teena Irani 07/18/2015, 6:18 PM

## 2015-07-18 NOTE — Patient Instructions (Signed)
Appointments will be scheduled with medical and radiation oncology. Follow up here in 2 weeks. Wear you bra for comfort. Activity as tolerated.

## 2015-07-19 LAB — SURGICAL PATHOLOGY

## 2015-07-20 ENCOUNTER — Inpatient Hospital Stay: Payer: 59 | Attending: Oncology | Admitting: Oncology

## 2015-07-20 ENCOUNTER — Ambulatory Visit
Admission: RE | Admit: 2015-07-20 | Discharge: 2015-07-20 | Disposition: A | Discharge status: Home / Self Care | Payer: 59 | Source: Ambulatory Visit | Attending: Radiation Oncology | Admitting: Radiation Oncology

## 2015-07-20 ENCOUNTER — Encounter: Payer: Self-pay | Admitting: Radiation Oncology

## 2015-07-20 VITALS — BP 101/66 | HR 78 | Temp 96.4°F | Wt 121.2 lb

## 2015-07-20 DIAGNOSIS — C50312 Malignant neoplasm of lower-inner quadrant of left female breast: Secondary | ICD-10-CM | POA: Diagnosis not present

## 2015-07-20 DIAGNOSIS — M1990 Unspecified osteoarthritis, unspecified site: Secondary | ICD-10-CM

## 2015-07-20 DIAGNOSIS — Z170 Estrogen receptor positive status [ER+]: Secondary | ICD-10-CM | POA: Diagnosis not present

## 2015-07-20 DIAGNOSIS — G478 Other sleep disorders: Secondary | ICD-10-CM | POA: Insufficient documentation

## 2015-07-20 DIAGNOSIS — Z803 Family history of malignant neoplasm of breast: Secondary | ICD-10-CM | POA: Insufficient documentation

## 2015-07-20 DIAGNOSIS — Z85820 Personal history of malignant melanoma of skin: Secondary | ICD-10-CM | POA: Insufficient documentation

## 2015-07-20 DIAGNOSIS — Z8041 Family history of malignant neoplasm of ovary: Secondary | ICD-10-CM | POA: Diagnosis not present

## 2015-07-20 DIAGNOSIS — Z7982 Long term (current) use of aspirin: Secondary | ICD-10-CM | POA: Insufficient documentation

## 2015-07-20 DIAGNOSIS — Z510 Encounter for antineoplastic radiation therapy: Secondary | ICD-10-CM | POA: Insufficient documentation

## 2015-07-20 DIAGNOSIS — C50512 Malignant neoplasm of lower-outer quadrant of left female breast: Secondary | ICD-10-CM | POA: Insufficient documentation

## 2015-07-20 DIAGNOSIS — C50912 Malignant neoplasm of unspecified site of left female breast: Secondary | ICD-10-CM

## 2015-07-20 DIAGNOSIS — Z79899 Other long term (current) drug therapy: Secondary | ICD-10-CM | POA: Diagnosis not present

## 2015-07-20 DIAGNOSIS — C50212 Malignant neoplasm of upper-inner quadrant of left female breast: Secondary | ICD-10-CM

## 2015-07-20 NOTE — Progress Notes (Signed)
Patient does not have living will. Information given. Never smoked.  Patient here for new evaluation regarding left breast cancer. Referred by Dr Bary Castilla.  Patient c/o tenderness in left breast.  Having a hard time getting comfortable to sleep.

## 2015-07-20 NOTE — Consult Note (Signed)
Except an outstanding is perfect of Radiation Oncology NEW PATIENT EVALUATION  Name: Lauren Mayo  MRN: [id number]  Date:   07/20/2015     DOB: 12/15/1955   This 59 y.o. female patient presents to the clinic for initial evaluation of breast cancer stage I (T1 BN 0 M0) lobular carcinoma ER/PR positive for whole breast radiation plus aromatase inhibitor.  REFERRING PHYSICIAN: Sparks, Jeffrey D, MD  CHIEF COMPLAINT: No chief complaint on file.   DIAGNOSIS: There were no encounter diagnoses.   PREVIOUS INVESTIGATIONS:  Mammograms and ultrasound reviewed Surgical pathology report reviewed Clinical notes reviewed Case was reviewed at our multimodality breast conference  HPI: Patient is a pleasant 59-year-old female who presented with an abnormal mammogram the left breast showing 2 separate lesions one at the 8:00 position 4 cm from the nipple and another one at 11:00 position 2 cm from the nipple. The 11:00 lesion was a fibroadenoma with atypical lobular hyperplasia. The 7:30 lesion was invasive lobular carcinoma classic type also with lobular carcinoma in situ noted. This was grade 2. Tumor was ER positive PR negative. HER-2/neu was not overexpressed. She went on to have a wide local excision showing invasive lobular carcinoma 0.9 cm in greatest dimension at the 7:00 position. Also at the 11:00 position was lobular carcinoma in situ. 4 sentinel lymph nodes were performed all negative for metastatic disease. Margins were clear but close at 1.5 mm. She has done well postoperatively has see medical oncology and based on the size of the lesion an ER status will have aromatase inhibitor therapy with no systemic chemotherapy. She still quite bruised as her surgery was just last week.  PLANNED TREATMENT REGIMEN: Whole breast radiation plus aromatase inhibitor  PAST MEDICAL HISTORY:  has a past medical history of Arthritis; Family history of adverse reaction to anesthesia; Hypercholesteremia; BRCA  negative (06-22-15); Cancer; Cancer of breast (2016); and Melanoma.    PAST SURGICAL HISTORY:  Past Surgical History  Procedure Laterality Date  . Abdominal hysterectomy  1990  . Leg skin lesion  biopsy / excision Left 2012,2015    melanoma  . Colonoscopy  2012    [facility]  . Back surgery      LUMBAR-HERNIATED DISC  . Bunionectomy Bilateral   . Breast lumpectomy with sentinel lymph node biopsy Left 07/12/2015    Procedure: BREAST WIDE EXCISION WITH SENTINEL LYMPH NODE BX, MASTOPLASTY ;  Surgeon: Jeffrey W Byrnett, MD;  Location: [facility] ORS;  Service: General;  Laterality: Left;  . Breast biopsy Left     benign  . Breast biopsy Left 06/16/2015    INVASIVE LOBULAR CARCINOMA  . Breast surgery Left 07/13/2015     Wide excision, mastoplasty, sentinel node biopsy.    FAMILY HISTORY: family history includes Breast cancer (age of onset: 60) in her paternal aunt; Breast cancer (age of onset: 63) in her sister; Cancer (age of onset: 63) in her maternal aunt; Diabetes in her father; Hypertension in her mother.  SOCIAL HISTORY:  reports that she has never smoked. She has never used smokeless tobacco. She reports that she drinks alcohol. She reports that she does not use illicit drugs.  ALLERGIES: Review of patient's allergies indicates no known allergies.  MEDICATIONS:  Current Outpatient Prescriptions  Medication Sig Dispense Refill  . aspirin 81 MG tablet Take 81 mg by mouth daily as needed for pain.    . ibuprofen (ADVIL,MOTRIN) 200 MG tablet Take 200 mg by mouth every 6 (six) hours as needed.    .   Vitamin D, Ergocalciferol, (DRISDOL) 50000 UNITS CAPS capsule Take 50,000 Units by mouth every 7 (seven) days.      No current facility-administered medications for this encounter.    ECOG PERFORMANCE STATUS:  0 - Asymptomatic  REVIEW OF SYSTEMS:  Patient denies any weight loss, fatigue, weakness, fever, chills or night sweats. Patient denies any loss of vision, blurred vision. Patient denies any  ringing  of the ears or hearing loss. No irregular heartbeat. Patient denies heart murmur or history of fainting. Patient denies any chest pain or pain radiating to her upper extremities. Patient denies any shortness of breath, difficulty breathing at night, cough or hemoptysis. Patient denies any swelling in the lower legs. Patient denies any nausea vomiting, vomiting of blood, or coffee ground material in the vomitus. Patient denies any stomach pain. Patient states has had normal bowel movements no significant constipation or diarrhea. Patient denies any dysuria, hematuria or significant nocturia. Patient denies any problems walking, swelling in the joints or loss of balance. Patient denies any skin changes, loss of hair or loss of weight. Patient denies any excessive worrying or anxiety or significant depression. Patient denies any problems with insomnia. Patient denies excessive thirst, polyuria, polydipsia. Patient denies any swollen glands, patient denies easy bruising or easy bleeding. Patient denies any recent infections, allergies or URI. Patient "s visual fields have not changed significantly in recent time.    PHYSICAL EXAM: There were no vitals taken for this visit. Patient is status post wide local excision of the left breast with marked bruising. She's also has a sentinel node incision which are both well-healed. No dominant mass or nodularity is noted in either breast in 2 positions examined. No axillary or supraclavicular adenopathy is appreciated. Well-developed well-nourished patient in NAD. HEENT reveals PERLA, EOMI, discs not visualized.  Oral cavity is clear. No oral mucosal lesions are identified. Neck is clear without evidence of cervical or supraclavicular adenopathy. Lungs are clear to A&P. Cardiac examination is essentially unremarkable with regular rate and rhythm without murmur rub or thrill. Abdomen is benign with no organomegaly or masses noted. Motor sensory and DTR levels are  equal and symmetric in the upper and lower extremities. Cranial nerves II through XII are grossly intact. Proprioception is intact. No peripheral adenopathy or edema is identified. No motor or sensory levels are noted. Crude visual fields are within normal range.   LABORATORY DATA: Pathology reports are reviewed    RADIOLOGY RESULTS: Mammograms and ultrasound reviewed, MRI of breast is reviewed   IMPRESSION: Stage I invasive lobular carcinoma of the left breast as was wide local excision and sentinel node biopsy ER/PR positive PR negative HER-2/neu negative in 59 year old female  PLAN: Case was presented at our combined modality breast conference with recommendation made for aromatase inhibitor no systemic chemotherapy based on the size and ER/PR positive nature of her tumor. I will plan on delivering hypofractionated course of radiation therapy to her left breast boosting her scar another 1600 cGy based on the close margin. Risks and benefits of treatment were explained to the patient. Side effects including inclusion of some superficial lung, skin reaction, fatigue, alteration of blood counts, all were discussed in detail with the patient. Nurse navigator was present. Patient will also be candidate for aromatase inhibitor therapy after completion of radiation. I've set up and ordered CT simulation in about of months time. It's a little farther out than like to go but the patient has already scheduled a week vacation in middle of September we  will wait her return to do CT simulation and start her treatments.  I would like to take this opportunity for allowing me to participate in the care of your patient..  CHRYSTAL,GLENN S., MD          

## 2015-07-21 ENCOUNTER — Encounter: Payer: Self-pay | Admitting: Oncology

## 2015-07-21 NOTE — Progress Notes (Signed)
Lauren Mayo @ [facility] Telephone:(336) [phone]  Fax:(336) [place]: [birth year]  MR#: [id number]  SPQ#:[id number]  Patient Care Team: [name], MD as PCP - General (Internal Medicine) [name], MD (Internal Medicine) [name], MD (General Surgery)  CHIEF COMPLAINT:  Chief Complaint  Patient presents with  . New Evaluation  1.  Cancer of the left breast 9 mm tumor at 7:30 position.  Patient underwent lumpectomy and sentinel lymph node biopsy.  Invasive lobular carcinoma T1 N0 M0 tumor (4 sentinel lymph nodes were negative) estrogen receptor positive progesterone receptor negative HER-2 receptor negative (Diagnosis in July of 2016)  2.  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  Patient is getting regular mammograms.  Patient did not have any hormone replacement therapy. Recent mammogram revealed hypoechoic solid-appearing mass there was another mass was was overall and well circumscribed.. Patient underwent initial biopsy which was invasive lobular carcinoma present and finally underwent lumpectomy and sentinel lymph node biopsy and was referred to me as well as to the radiation oncologist for consideration of local and systemic therapy. Base slightly apprehensive The patient has a significant family history of breast cancer and ovarian cancer and already undergone BRCA mutation which was reported to be negative REVIEW OF SYSTEMS:   GENERAL:  Feels good.  Active.  No fevers, sweats or weight loss. PERFORMANCE STATUS (ECOG): 0 HEENT:  No visual changes, runny nose, sore throat, mouth sores or tenderness. Lungs: No shortness of breath or cough.  No hemoptysis. Cardiac:  No chest pain, palpitations, orthopnea, or PND. GI:  No nausea, vomiting, diarrhea, constipation, melena or  hematochezia. GU:  No urgency, frequency, dysuria, or hematuria. Musculoskeletal:  No back pain.  No joint pain.  No muscle tenderness. Extremities:  No pain or swelling. Skin:  No rashes or skin changes. Neuro:  No headache, numbness or weakness, balance or coordination issues. Endocrine:  No diabetes, thyroid issues, hot flashes or night sweats. Psych:  No mood changes, depression or anxiety. Pain:  No focal pain. Patient has sleep disturbance because of discomfort Review of systems:  All other systems reviewed and found to be negative.  As per HPI. Otherwise, a complete review of systems is negatve.  PAST MEDICAL HISTORY: Past Medical History  Diagnosis Date  . Arthritis     RIGHT LEG  . Family history of adverse reaction to anesthesia     SISTER-TROUBLE WAKING UP  . Hypercholesteremia   . BRCA negative 06-22-15    Dr Bary Castilla  . Cancer     melaoma   . Cancer of breast 2016    INVASIVE LOBULAR CARCINOMA, T1b,N0. ER positive, PR negative, HER-2/neu not overexpressed.  . Melanoma     PAST SURGICAL HISTORY: Past Surgical History  Procedure Laterality Date  . Abdominal hysterectomy  1990  . Leg skin lesion  biopsy / excision Left 2012,2015    melanoma  . Colonoscopy  2012    [facility]  . Back surgery      LUMBAR-HERNIATED DISC  . Bunionectomy Bilateral   . Breast lumpectomy with sentinel lymph node biopsy Left 07/12/2015    Procedure: BREAST WIDE EXCISION WITH SENTINEL LYMPH NODE BX, MASTOPLASTY ;  Surgeon: [name], MD;  Location: [facility] ORS;  Service: General;  Laterality: Left;  . Breast biopsy Left  benign  . Breast biopsy Left 06/16/2015    INVASIVE LOBULAR CARCINOMA  . Breast surgery Left 07/13/2015     Wide excision, mastoplasty, sentinel node biopsy.    FAMILY HISTORY Family History  Problem Relation Age of Onset  . Breast cancer Sister 55    2016  . Breast cancer Paternal Aunt 21  . Hypertension Mother   . Diabetes Father   . Cancer Maternal Aunt  63    ovarian      ADVANCED DIRECTIVES: Patient does have advance healthcare directive, Patient   does not desire to make any changes   HEALTH MAINTENANCE: Social History  Substance Use Topics  . Smoking status: Never Smoker   . Smokeless tobacco: Never Used  . Alcohol Use: 0.0 oz/week    0 Standard drinks or equivalent per week     Comment: OCC      No Known Allergies  Current Outpatient Prescriptions  Medication Sig Dispense Refill  . aspirin 81 MG tablet Take 81 mg by mouth daily as needed for pain.    Marland Kitchen ibuprofen (ADVIL,MOTRIN) 200 MG tablet Take 200 mg by mouth every 6 (six) hours as needed.    . Vitamin D, Ergocalciferol, (DRISDOL) 50000 UNITS CAPS capsule Take 50,000 Units by mouth every 7 (seven) days.      No current facility-administered medications for this visit.    OBJECTIVE: PHYSICAL EXAM: GENERAL:  Well developed, well nourished, sitting comfortably in the exam room in no acute distress. MENTAL STATUS:  Alert and oriented to person, place and time.  ENT:  Oropharynx clear without lesion.  Tongue normal. Mucous membranes moist.  RESPIRATORY:  Clear to auscultation without rales, wheezes or rhonchi. CARDIOVASCULAR:  Regular rate and rhythm without murmur, rub or gallop. BREAST:  Right breast without masses, skin changes or nipple discharge.  Left breast without masses, skin changes or nipple discharge. patient had ecchymosis left breast ABDOMEN:  Soft, non-tender, with active bowel sounds, and no hepatosplenomegaly.  No masses. BACK:  No CVA tenderness.  No tenderness on percussion of the back or rib cage. SKIN:  No rashes, ulcers or lesions. EXTREMITIES: No edema, no skin discoloration or tenderness.  No palpable cords. LYMPH NODES: No palpable cervical, supraclavicular, axillary or inguinal adenopathy  NEUROLOGICAL: Unremarkable. PSYCH:  Appropriate.  Filed Vitals:   07/20/15 1358  BP: 101/66  Pulse: 78  Temp: 96.4 F (35.8 C)     Body mass index  is 23.68 kg/(m^2).    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 A mass in the left breast at 11 o'clock which was biopsied revealed a fibroadenoma and atypical lobular hyperplasia.  LABS:  BUN and creatinine were obtained on site at [facility] at  [address]  Results:  BUN 14 mg/dL,  Creatinine 0 point a mg/dL.  EXAM: BILATERAL BREAST MRI WITH AND WITHOUT CONTRAST  TECHNIQUE: Multiplanar, multisequence MR images of both breasts were obtained prior to and following the intravenous administration of 10 ml of MultiHance.  THREE-DIMENSIONAL MR IMAGE RENDERING ON INDEPENDENT WORKSTATION:  Three-dimensional MR images were rendered by post-processing of the original MR data on an independent workstation. The three-dimensional MR images were interpreted, and findings are reported in the following complete MRI report  for this study. Three dimensional images were evaluated at the independent DynaCad workstation  COMPARISON:  Previous exam(s).  FINDINGS: Breast composition: c.  Heterogeneous fibroglandular tissue.  Background parenchymal enhancement: Moderate.  Right breast: There is an oval circumscribed enhancing mass in the central right breast anterior depth measuring 0.8 cm AP, 0.6 cm transverse, and 0.7 cm craniocaudal. This corresponds with a mammographically stable mass and given the suggestion of nonenhancing internal septations and persistent kinetics this likely represents a fibroadenoma. Additionally, several nonenhancing T2 bright masses are seen in the right breast compatible with cysts.  Left breast: Irregular enhancing with associated biopsy clip artifact in the lower inner right breast compatible with biopsy proven malignancy measures approximately  0.7 cm AP, 0.7 cm transverse, and 0.7 cm craniocaudal. Biopsy marking clip is present in the central upper left breast at site of biopsy proven fibroadenoma and atypical lobular hyperplasia, with no associated abnormal enhancement or enhancing masses seen in this location. No additional enhancing masses or abnormal areas of enhancement seen in the left breast to suggest additional sites of disease.  Lymph nodes: No morphologically abnormal axillary lymph nodes seen. No internal mammary lymphadenopathy.  Ancillary findings:  None.  IMPRESSION: 1. Biopsy proven malignancy in the lower inner left breast measures up to 0.7 cm. No abnormal enhancement or enhancing masses seen at site of biopsy proven fibroadenoma and atypical lobular hyperplasia in the upper left breast.  2. No MRI evidence of malignancy in the right breast; several cysts are seen in the right breast as well as a circumscribed oval enhancing mass which corresponds to a mammographically stable mass demonstrating imaging features suggestive of a fibroadenoma.  RECOMMENDATION: Treatment plan for known left breast malignancy.  BI-RADS CATEGORY  6: Known biopsy-proven malignancy.   Electronically Signed   By: Everlean Alstrom M.D.   On: 06/29/2015 13:09   Nm Sentinel Node Inj-no Rpt (breast)  07/12/2015   CLINICAL DATA: left breast cancer   Sulfur colloid was injected intradermally by the nuclear medicine  technologist for breast cancer sentinel node localization.    US Breast Complete Uni Left Inc Axilla  07/05/2015   Ultrasound examination of the left breast was undertaken to confirm both  lesion would be visible for wide excision.  In the 8:00 position (arm over the shoulder), 4 cm from the nipple the  area of previous biopsy measuring 0.4 x 0.5 x 0.6 cm is identified.  At the 11:00 position, 2 cm from the nipple the previous biopsy site  measuring up to 0.7 cm in diameter is visible. BI-RADS-6.    ASSESSMENT:   Carcinoma of the left breast status  post lumpectomy and sentinel lymph node evaluation We discussed possibility of local therapy with radiation patient is being evaluated by radiation oncologist.  Today after radiation is finished I discuss possibility of entire hormonal therapy with letrozole vitamin D and calcium in bone density studies would be recommended Patient will be reevaluated after radiation therapy is finished Need for self breast examination Regular mammograms and evaluation Lifestyle changes on had been discussed to prevent incidence of breast cancer.     Patient expressed understanding and was in agreement with this plan. She also understands that She can call clinic at any time with any questions, concerns, or complaints.    Breast cancer, female   Staging form: Breast, AJCC 7th Edition     Clinical: Stage IA (T1, N0, M0) - Signed by Forest Gleason, MD on 07/21/2015   Forest Gleason, MD  07/21/2015 7:41 AM

## 2015-07-31 ENCOUNTER — Ambulatory Visit (INDEPENDENT_AMBULATORY_CARE_PROVIDER_SITE_OTHER): Payer: 59 | Admitting: General Surgery

## 2015-07-31 ENCOUNTER — Encounter: Payer: Self-pay | Admitting: General Surgery

## 2015-07-31 VITALS — BP 124/72 | HR 76 | Resp 14 | Ht 60.0 in | Wt 121.0 lb

## 2015-07-31 DIAGNOSIS — C50912 Malignant neoplasm of unspecified site of left female breast: Secondary | ICD-10-CM

## 2015-07-31 NOTE — Patient Instructions (Addendum)
The patient is aware to call back for any questions or concerns. Gently massage area with cream.

## 2015-07-31 NOTE — Progress Notes (Signed)
Patient ID: Lauren Mayo, female   DOB: 02-Feb-1956, 59 y.o.   MRN: [id number]  Chief Complaint  Patient presents with  . Routine Post Op    Left breast cancer    HPI Lauren Mayo is a 59 y.o. female here for follow up for left breast wide excision and SLN biopsy done on 07/12/15. She reports some tenderness and itching in her left breast, getting better.  HPI  Past Medical History  Diagnosis Date  . Arthritis     RIGHT LEG  . Family history of adverse reaction to anesthesia     SISTER-TROUBLE WAKING UP  . Hypercholesteremia   . BRCA negative 06-22-15    Dr Bary Castilla  . Cancer     melaoma   . Cancer of breast 2016    INVASIVE LOBULAR CARCINOMA, T1b,N0. ER positive, PR negative, HER-2/neu not overexpressed.  . Melanoma     Past Surgical History  Procedure Laterality Date  . Abdominal hysterectomy  1990  . Leg skin lesion  biopsy / excision Left 2012,2015    melanoma  . Colonoscopy  2012    [facility]  . Back surgery      LUMBAR-HERNIATED DISC  . Bunionectomy Bilateral   . Breast lumpectomy with sentinel lymph node biopsy Left 07/12/2015    Procedure: BREAST WIDE EXCISION WITH SENTINEL LYMPH NODE BX, MASTOPLASTY ;  Surgeon: [name], MD;  Location: [facility] ORS;  Service: General;  Laterality: Left;  . Breast biopsy Left     benign  . Breast biopsy Left 06/16/2015    INVASIVE LOBULAR CARCINOMA  . Breast surgery Left 07/13/2015     Wide excision, mastoplasty, sentinel node biopsy.    Family History  Problem Relation Age of Onset  . Breast cancer Sister 32    2016  . Breast cancer Paternal Aunt 43  . Hypertension Mother   . Diabetes Father   . Cancer Maternal Aunt 63    ovarian    Social History Social History  Substance Use Topics  . Smoking status: Never Smoker   . Smokeless tobacco: Never Used  . Alcohol Use: 0.0 oz/week    0 Standard drinks or equivalent per week     Comment: OCC    No Known Allergies  Current Outpatient Prescriptions  Medication  Sig Dispense Refill  . aspirin 81 MG tablet Take 81 mg by mouth daily as needed for pain.    Marland Kitchen ibuprofen (ADVIL,MOTRIN) 200 MG tablet Take 200 mg by mouth every 6 (six) hours as needed.    . Vitamin D, Ergocalciferol, (DRISDOL) 50000 UNITS CAPS capsule Take 50,000 Units by mouth every 7 (seven) days.      No current facility-administered medications for this visit.    Review of Systems Review of Systems  Constitutional: Positive for fatigue.  Respiratory: Negative.   Cardiovascular: Negative.     Blood pressure 124/72, pulse 76, resp. rate 14, height 5' (1.524 m), weight 121 lb (54.885 kg).  Physical Exam Physical Exam  Constitutional: She is oriented to person, place, and time. She appears well-developed and well-nourished.  Eyes: Conjunctivae are normal. No scleral icterus.  Cardiovascular: Normal rate, regular rhythm and normal heart sounds.   Pulmonary/Chest: Effort normal and breath sounds normal. Right breast exhibits no inverted nipple, no mass, no nipple discharge, no skin change and no tenderness. Left breast exhibits no inverted nipple, no mass, no nipple discharge, no skin change and no tenderness.    Well healed incisions in left breast.  Lymphadenopathy:    She has no cervical adenopathy.  Neurological: She is alert and oriented to person, place, and time.  Skin: Skin is warm and dry.  Psychiatric: Her behavior is normal.    Data Reviewed The patient reports that she has met with both Dr. Oliva Bustard and Dr. Baruch Gouty. No adjuvant chemotherapy recommended. Specialized testing not recommended. Radiation to begin third week of September.  Assessment    Doing well status post wide excision, mastoplasty and axillary node biopsy.    Plan    We'll plan for a follow-up examination in 3 months.     Patient has a radiation appointment on 08/29/2015 at the cancer center. Patient aware that her energy will slowly return, will resume activity as tolerated. Follow up in 3  months.    PCP: Fulton Reek, MD   [name] 07/31/2015, 10:02 AM

## 2015-08-14 ENCOUNTER — Encounter: Payer: Self-pay | Admitting: General Surgery

## 2015-08-29 ENCOUNTER — Ambulatory Visit
Admission: RE | Admit: 2015-08-29 | Discharge: 2015-08-29 | Disposition: A | Discharge status: Home / Self Care | Payer: 59 | Source: Ambulatory Visit | Attending: Radiation Oncology | Admitting: Radiation Oncology

## 2015-08-29 DIAGNOSIS — Z170 Estrogen receptor positive status [ER+]: Secondary | ICD-10-CM | POA: Diagnosis not present

## 2015-08-29 DIAGNOSIS — C50512 Malignant neoplasm of lower-outer quadrant of left female breast: Secondary | ICD-10-CM | POA: Diagnosis not present

## 2015-08-29 DIAGNOSIS — Z510 Encounter for antineoplastic radiation therapy: Secondary | ICD-10-CM | POA: Diagnosis not present

## 2015-09-04 ENCOUNTER — Other Ambulatory Visit: Payer: Self-pay | Admitting: *Deleted

## 2015-09-04 DIAGNOSIS — C50912 Malignant neoplasm of unspecified site of left female breast: Secondary | ICD-10-CM

## 2015-09-04 DIAGNOSIS — C50512 Malignant neoplasm of lower-outer quadrant of left female breast: Secondary | ICD-10-CM | POA: Diagnosis not present

## 2015-09-05 ENCOUNTER — Ambulatory Visit
Admission: RE | Admit: 2015-09-05 | Discharge: 2015-09-05 | Disposition: A | Discharge status: Home / Self Care | Payer: 59 | Source: Ambulatory Visit | Attending: Radiation Oncology | Admitting: Radiation Oncology

## 2015-09-05 DIAGNOSIS — C50512 Malignant neoplasm of lower-outer quadrant of left female breast: Secondary | ICD-10-CM | POA: Diagnosis not present

## 2015-09-06 ENCOUNTER — Ambulatory Visit
Admission: RE | Admit: 2015-09-06 | Discharge: 2015-09-06 | Disposition: A | Discharge status: Home / Self Care | Payer: 59 | Source: Ambulatory Visit | Attending: Radiation Oncology | Admitting: Radiation Oncology

## 2015-09-06 DIAGNOSIS — C50512 Malignant neoplasm of lower-outer quadrant of left female breast: Secondary | ICD-10-CM | POA: Diagnosis not present

## 2015-09-07 ENCOUNTER — Ambulatory Visit
Admission: RE | Admit: 2015-09-07 | Discharge: 2015-09-07 | Disposition: A | Discharge status: Home / Self Care | Payer: 59 | Source: Ambulatory Visit | Attending: Radiation Oncology | Admitting: Radiation Oncology

## 2015-09-07 DIAGNOSIS — C50512 Malignant neoplasm of lower-outer quadrant of left female breast: Secondary | ICD-10-CM | POA: Diagnosis not present

## 2015-09-08 ENCOUNTER — Ambulatory Visit
Admission: RE | Admit: 2015-09-08 | Discharge: 2015-09-08 | Disposition: A | Discharge status: Home / Self Care | Payer: 59 | Source: Ambulatory Visit | Attending: Radiation Oncology | Admitting: Radiation Oncology

## 2015-09-08 DIAGNOSIS — C50512 Malignant neoplasm of lower-outer quadrant of left female breast: Secondary | ICD-10-CM | POA: Diagnosis not present

## 2015-09-11 ENCOUNTER — Ambulatory Visit
Admission: RE | Admit: 2015-09-11 | Discharge: 2015-09-11 | Disposition: A | Discharge status: Home / Self Care | Payer: 59 | Source: Ambulatory Visit | Attending: Radiation Oncology | Admitting: Radiation Oncology

## 2015-09-11 DIAGNOSIS — C50512 Malignant neoplasm of lower-outer quadrant of left female breast: Secondary | ICD-10-CM | POA: Diagnosis not present

## 2015-09-12 ENCOUNTER — Ambulatory Visit
Admission: RE | Admit: 2015-09-12 | Discharge: 2015-09-12 | Disposition: A | Discharge status: Home / Self Care | Payer: 59 | Source: Ambulatory Visit | Attending: Radiation Oncology | Admitting: Radiation Oncology

## 2015-09-12 DIAGNOSIS — C50512 Malignant neoplasm of lower-outer quadrant of left female breast: Secondary | ICD-10-CM | POA: Diagnosis not present

## 2015-09-13 ENCOUNTER — Ambulatory Visit
Admission: RE | Admit: 2015-09-13 | Discharge: 2015-09-13 | Disposition: A | Discharge status: Home / Self Care | Payer: 59 | Source: Ambulatory Visit | Attending: Radiation Oncology | Admitting: Radiation Oncology

## 2015-09-13 DIAGNOSIS — C50512 Malignant neoplasm of lower-outer quadrant of left female breast: Secondary | ICD-10-CM | POA: Diagnosis not present

## 2015-09-14 ENCOUNTER — Ambulatory Visit
Admission: RE | Admit: 2015-09-14 | Discharge: 2015-09-14 | Disposition: A | Discharge status: Home / Self Care | Payer: 59 | Source: Ambulatory Visit | Attending: Radiation Oncology | Admitting: Radiation Oncology

## 2015-09-14 DIAGNOSIS — C50512 Malignant neoplasm of lower-outer quadrant of left female breast: Secondary | ICD-10-CM | POA: Diagnosis not present

## 2015-09-15 ENCOUNTER — Ambulatory Visit
Admission: RE | Admit: 2015-09-15 | Discharge: 2015-09-15 | Disposition: A | Discharge status: Home / Self Care | Payer: 59 | Source: Ambulatory Visit | Attending: Radiation Oncology | Admitting: Radiation Oncology

## 2015-09-15 DIAGNOSIS — C50512 Malignant neoplasm of lower-outer quadrant of left female breast: Secondary | ICD-10-CM | POA: Diagnosis not present

## 2015-09-18 ENCOUNTER — Ambulatory Visit
Admission: RE | Admit: 2015-09-18 | Discharge: 2015-09-18 | Disposition: A | Discharge status: Home / Self Care | Payer: 59 | Source: Ambulatory Visit | Attending: Radiation Oncology | Admitting: Radiation Oncology

## 2015-09-18 DIAGNOSIS — C50512 Malignant neoplasm of lower-outer quadrant of left female breast: Secondary | ICD-10-CM | POA: Diagnosis not present

## 2015-09-19 ENCOUNTER — Ambulatory Visit
Admission: RE | Admit: 2015-09-19 | Discharge: 2015-09-19 | Disposition: A | Discharge status: Home / Self Care | Payer: 59 | Source: Ambulatory Visit | Attending: Radiation Oncology | Admitting: Radiation Oncology

## 2015-09-19 ENCOUNTER — Inpatient Hospital Stay: Payer: 59 | Attending: Radiation Oncology

## 2015-09-19 DIAGNOSIS — C50312 Malignant neoplasm of lower-inner quadrant of left female breast: Secondary | ICD-10-CM | POA: Insufficient documentation

## 2015-09-19 DIAGNOSIS — C50912 Malignant neoplasm of unspecified site of left female breast: Secondary | ICD-10-CM

## 2015-09-19 DIAGNOSIS — C50512 Malignant neoplasm of lower-outer quadrant of left female breast: Secondary | ICD-10-CM | POA: Diagnosis not present

## 2015-09-19 LAB — CBC
HCT: 38.4 % (ref 35.0–47.0)
HEMOGLOBIN: 12.9 g/dL (ref 12.0–16.0)
MCH: 29 pg (ref 26.0–34.0)
MCHC: 33.6 g/dL (ref 32.0–36.0)
MCV: 86.4 fL (ref 80.0–100.0)
Platelets: 259 10*3/uL (ref 150–440)
RBC: 4.45 MIL/uL (ref 3.80–5.20)
RDW: 13.5 % (ref 11.5–14.5)
WBC: 4 10*3/uL (ref 3.6–11.0)

## 2015-09-20 ENCOUNTER — Ambulatory Visit
Admission: RE | Admit: 2015-09-20 | Discharge: 2015-09-20 | Disposition: A | Discharge status: Home / Self Care | Payer: 59 | Source: Ambulatory Visit | Attending: Radiation Oncology | Admitting: Radiation Oncology

## 2015-09-20 DIAGNOSIS — C50512 Malignant neoplasm of lower-outer quadrant of left female breast: Secondary | ICD-10-CM | POA: Diagnosis not present

## 2015-09-21 ENCOUNTER — Ambulatory Visit
Admission: RE | Admit: 2015-09-21 | Discharge: 2015-09-21 | Disposition: A | Discharge status: Home / Self Care | Payer: 59 | Source: Ambulatory Visit | Attending: Radiation Oncology | Admitting: Radiation Oncology

## 2015-09-21 DIAGNOSIS — C50512 Malignant neoplasm of lower-outer quadrant of left female breast: Secondary | ICD-10-CM | POA: Diagnosis not present

## 2015-09-22 ENCOUNTER — Ambulatory Visit
Admission: RE | Admit: 2015-09-22 | Discharge: 2015-09-22 | Disposition: A | Discharge status: Home / Self Care | Payer: 59 | Source: Ambulatory Visit | Attending: Radiation Oncology | Admitting: Radiation Oncology

## 2015-09-22 DIAGNOSIS — C50512 Malignant neoplasm of lower-outer quadrant of left female breast: Secondary | ICD-10-CM | POA: Diagnosis not present

## 2015-09-25 ENCOUNTER — Ambulatory Visit
Admission: RE | Admit: 2015-09-25 | Discharge: 2015-09-25 | Disposition: A | Discharge status: Home / Self Care | Payer: 59 | Source: Ambulatory Visit | Attending: Radiation Oncology | Admitting: Radiation Oncology

## 2015-09-25 DIAGNOSIS — C50512 Malignant neoplasm of lower-outer quadrant of left female breast: Secondary | ICD-10-CM | POA: Diagnosis not present

## 2015-09-26 ENCOUNTER — Ambulatory Visit
Admission: RE | Admit: 2015-09-26 | Discharge: 2015-09-26 | Disposition: A | Discharge status: Home / Self Care | Payer: 59 | Source: Ambulatory Visit | Attending: Radiation Oncology | Admitting: Radiation Oncology

## 2015-09-26 DIAGNOSIS — C50512 Malignant neoplasm of lower-outer quadrant of left female breast: Secondary | ICD-10-CM | POA: Diagnosis not present

## 2015-09-27 ENCOUNTER — Ambulatory Visit
Admission: RE | Admit: 2015-09-27 | Discharge: 2015-09-27 | Disposition: A | Discharge status: Home / Self Care | Payer: 59 | Source: Ambulatory Visit | Attending: Radiation Oncology | Admitting: Radiation Oncology

## 2015-09-27 DIAGNOSIS — C50512 Malignant neoplasm of lower-outer quadrant of left female breast: Secondary | ICD-10-CM | POA: Diagnosis not present

## 2015-09-28 ENCOUNTER — Ambulatory Visit
Admission: RE | Admit: 2015-09-28 | Discharge: 2015-09-28 | Disposition: A | Discharge status: Home / Self Care | Payer: 59 | Source: Ambulatory Visit | Attending: Radiation Oncology | Admitting: Radiation Oncology

## 2015-09-28 DIAGNOSIS — C50512 Malignant neoplasm of lower-outer quadrant of left female breast: Secondary | ICD-10-CM | POA: Diagnosis not present

## 2015-09-29 ENCOUNTER — Ambulatory Visit
Admission: RE | Admit: 2015-09-29 | Discharge: 2015-09-29 | Disposition: A | Discharge status: Home / Self Care | Payer: 59 | Source: Ambulatory Visit | Attending: Radiation Oncology | Admitting: Radiation Oncology

## 2015-09-29 DIAGNOSIS — C50512 Malignant neoplasm of lower-outer quadrant of left female breast: Secondary | ICD-10-CM | POA: Diagnosis not present

## 2015-10-01 ENCOUNTER — Ambulatory Visit
Admission: RE | Admit: 2015-10-01 | Discharge: 2015-10-01 | Disposition: A | Discharge status: Home / Self Care | Payer: 59 | Source: Ambulatory Visit | Attending: Radiation Oncology | Admitting: Radiation Oncology

## 2015-10-02 ENCOUNTER — Ambulatory Visit
Admission: RE | Admit: 2015-10-02 | Discharge: 2015-10-02 | Disposition: A | Discharge status: Home / Self Care | Payer: 59 | Source: Ambulatory Visit | Attending: Radiation Oncology | Admitting: Radiation Oncology

## 2015-10-02 DIAGNOSIS — C50512 Malignant neoplasm of lower-outer quadrant of left female breast: Secondary | ICD-10-CM | POA: Diagnosis not present

## 2015-10-03 ENCOUNTER — Inpatient Hospital Stay: Payer: 59 | Attending: Radiation Oncology

## 2015-10-03 ENCOUNTER — Ambulatory Visit
Admission: RE | Admit: 2015-10-03 | Discharge: 2015-10-03 | Disposition: A | Discharge status: Home / Self Care | Payer: 59 | Source: Ambulatory Visit | Attending: Radiation Oncology | Admitting: Radiation Oncology

## 2015-10-03 DIAGNOSIS — E7800 Pure hypercholesterolemia, unspecified: Secondary | ICD-10-CM | POA: Diagnosis not present

## 2015-10-03 DIAGNOSIS — C50512 Malignant neoplasm of lower-outer quadrant of left female breast: Secondary | ICD-10-CM | POA: Diagnosis not present

## 2015-10-03 DIAGNOSIS — Z170 Estrogen receptor positive status [ER+]: Secondary | ICD-10-CM | POA: Diagnosis not present

## 2015-10-03 DIAGNOSIS — Z79899 Other long term (current) drug therapy: Secondary | ICD-10-CM | POA: Diagnosis not present

## 2015-10-03 DIAGNOSIS — Z803 Family history of malignant neoplasm of breast: Secondary | ICD-10-CM | POA: Insufficient documentation

## 2015-10-03 DIAGNOSIS — C50312 Malignant neoplasm of lower-inner quadrant of left female breast: Secondary | ICD-10-CM | POA: Insufficient documentation

## 2015-10-03 DIAGNOSIS — M1990 Unspecified osteoarthritis, unspecified site: Secondary | ICD-10-CM | POA: Diagnosis not present

## 2015-10-03 DIAGNOSIS — G479 Sleep disorder, unspecified: Secondary | ICD-10-CM | POA: Diagnosis not present

## 2015-10-03 DIAGNOSIS — Z79811 Long term (current) use of aromatase inhibitors: Secondary | ICD-10-CM | POA: Insufficient documentation

## 2015-10-03 DIAGNOSIS — Z85820 Personal history of malignant melanoma of skin: Secondary | ICD-10-CM | POA: Insufficient documentation

## 2015-10-03 DIAGNOSIS — C50912 Malignant neoplasm of unspecified site of left female breast: Secondary | ICD-10-CM

## 2015-10-03 DIAGNOSIS — Z923 Personal history of irradiation: Secondary | ICD-10-CM | POA: Insufficient documentation

## 2015-10-03 LAB — CBC
HCT: 37.7 % (ref 35.0–47.0)
Hemoglobin: 12.6 g/dL (ref 12.0–16.0)
MCH: 29.1 pg (ref 26.0–34.0)
MCHC: 33.5 g/dL (ref 32.0–36.0)
MCV: 86.9 fL (ref 80.0–100.0)
PLATELETS: 266 10*3/uL (ref 150–440)
RBC: 4.34 MIL/uL (ref 3.80–5.20)
RDW: 13.5 % (ref 11.5–14.5)
WBC: 3.9 10*3/uL (ref 3.6–11.0)

## 2015-10-04 ENCOUNTER — Ambulatory Visit
Admission: RE | Admit: 2015-10-04 | Discharge: 2015-10-04 | Disposition: A | Discharge status: Home / Self Care | Payer: 59 | Source: Ambulatory Visit | Attending: Radiation Oncology | Admitting: Radiation Oncology

## 2015-10-04 DIAGNOSIS — C50512 Malignant neoplasm of lower-outer quadrant of left female breast: Secondary | ICD-10-CM | POA: Diagnosis not present

## 2015-10-05 ENCOUNTER — Ambulatory Visit
Admission: RE | Admit: 2015-10-05 | Discharge: 2015-10-05 | Disposition: A | Discharge status: Home / Self Care | Payer: 59 | Source: Ambulatory Visit | Attending: Radiation Oncology | Admitting: Radiation Oncology

## 2015-10-05 DIAGNOSIS — C50512 Malignant neoplasm of lower-outer quadrant of left female breast: Secondary | ICD-10-CM | POA: Diagnosis not present

## 2015-10-06 ENCOUNTER — Ambulatory Visit
Admission: RE | Admit: 2015-10-06 | Discharge: 2015-10-06 | Disposition: A | Discharge status: Home / Self Care | Payer: 59 | Source: Ambulatory Visit | Attending: Radiation Oncology | Admitting: Radiation Oncology

## 2015-10-06 DIAGNOSIS — C50512 Malignant neoplasm of lower-outer quadrant of left female breast: Secondary | ICD-10-CM | POA: Diagnosis not present

## 2015-10-09 ENCOUNTER — Inpatient Hospital Stay (HOSPITAL_BASED_OUTPATIENT_CLINIC_OR_DEPARTMENT_OTHER): Payer: 59 | Admitting: Oncology

## 2015-10-09 ENCOUNTER — Ambulatory Visit
Admission: RE | Admit: 2015-10-09 | Discharge: 2015-10-09 | Disposition: A | Discharge status: Home / Self Care | Payer: 59 | Source: Ambulatory Visit | Attending: Radiation Oncology | Admitting: Radiation Oncology

## 2015-10-09 VITALS — BP 119/67 | HR 67 | Temp 96.7°F | Wt 120.0 lb

## 2015-10-09 DIAGNOSIS — E7800 Pure hypercholesterolemia, unspecified: Secondary | ICD-10-CM

## 2015-10-09 DIAGNOSIS — C50312 Malignant neoplasm of lower-inner quadrant of left female breast: Secondary | ICD-10-CM | POA: Diagnosis not present

## 2015-10-09 DIAGNOSIS — Z79811 Long term (current) use of aromatase inhibitors: Secondary | ICD-10-CM

## 2015-10-09 DIAGNOSIS — G479 Sleep disorder, unspecified: Secondary | ICD-10-CM

## 2015-10-09 DIAGNOSIS — Z79899 Other long term (current) drug therapy: Secondary | ICD-10-CM

## 2015-10-09 DIAGNOSIS — C50512 Malignant neoplasm of lower-outer quadrant of left female breast: Secondary | ICD-10-CM | POA: Diagnosis not present

## 2015-10-09 DIAGNOSIS — Z923 Personal history of irradiation: Secondary | ICD-10-CM

## 2015-10-09 DIAGNOSIS — Z85820 Personal history of malignant melanoma of skin: Secondary | ICD-10-CM

## 2015-10-09 DIAGNOSIS — Z803 Family history of malignant neoplasm of breast: Secondary | ICD-10-CM

## 2015-10-09 DIAGNOSIS — Z170 Estrogen receptor positive status [ER+]: Secondary | ICD-10-CM

## 2015-10-09 DIAGNOSIS — M1990 Unspecified osteoarthritis, unspecified site: Secondary | ICD-10-CM

## 2015-10-09 DIAGNOSIS — C50912 Malignant neoplasm of unspecified site of left female breast: Secondary | ICD-10-CM

## 2015-10-09 MED ORDER — LETROZOLE 2.5 MG PO TABS: 2.5000 mg | ORAL_TABLET | Freq: Every day | ORAL | Status: DC

## 2015-10-09 NOTE — Progress Notes (Signed)
Leonardo @ [facility] Telephone:(336) [phone]  Fax:(336) [place]: [birth year]  MR#: [id number]  LFY#:[id number]  Patient Care Team: [name], MD as PCP - General (Internal Medicine) [name], MD (Internal Medicine) [name], MD (General Surgery)  CHIEF COMPLAINT:  Chief Complaint  Patient presents with  . OTHER  1.  Cancer of the left breast 9 mm tumor at 7:30 position.  Patient underwent lumpectomy and sentinel lymph node biopsy.  Invasive lobular carcinoma T1 N0 M0 tumor (4 sentinel lymph nodes were negative) estrogen receptor positive progesterone receptor negative HER-2 receptor negative (Diagnosis in July of 2016)  2.  Negative for BRCA mutation Patient  Has  finished radiation therapy in   Nov.  of 2016 3.  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  Patient did not have any hormone replacement therapy.  Patient underwent initial biopsy which was invasive lobular carcinoma present and finally underwent lumpectomy and sentinel lymph node biopsy and was referred to me as well as to the radiation oncologist for consideration of local and systemic therapy. Base slightly apprehensive The patient has a significant family history of breast cancer and ovarian cancer and already undergone BRCA mutation which was reported to be negative  Patient has finished radiation therapy here to discuss starting an aromatase inhibitor. Tolerated radiation very well. REVIEW OF SYSTEMS:   GENERAL:  Feels good.  Active.  No fevers, sweats or weight loss. PERFORMANCE STATUS (ECOG): 0 HEENT:  No visual changes, runny nose, sore throat, mouth sores or tenderness. Lungs: No shortness of breath or cough.  No  hemoptysis. Cardiac:  No chest pain, palpitations, orthopnea, or PND. GI:  No nausea, vomiting, diarrhea, constipation, melena or hematochezia. GU:  No urgency, frequency, dysuria, or hematuria. Musculoskeletal:  No back pain.  No joint pain.  No muscle tenderness. Extremities:  No pain or swelling. Skin:  No rashes or skin changes. Neuro:  No headache, numbness or weakness, balance or coordination issues. Endocrine:  No diabetes, thyroid issues, hot flashes or night sweats. Psych:  No mood changes, depression or anxiety. Pain:  No focal pain. Patient has sleep disturbance because of discomfort Review of systems:  All other systems reviewed and found to be negative.  As per HPI. Otherwise, a complete review of systems is negatve.  PAST MEDICAL HISTORY: Past Medical History  Diagnosis Date  . Arthritis     RIGHT LEG  . Family history of adverse reaction to anesthesia     SISTER-TROUBLE WAKING UP  . Hypercholesteremia   . BRCA negative 06-22-15    Dr Bary Castilla  . Cancer     melaoma   . Cancer of breast 2016    INVASIVE LOBULAR CARCINOMA, T1b,N0. ER positive, PR negative, HER-2/neu not overexpressed.  . Melanoma     PAST SURGICAL HISTORY: Past Surgical History  Procedure Laterality Date  . Abdominal hysterectomy  1990  . Leg skin lesion  biopsy / excision Left 2012,2015    melanoma  . Colonoscopy  2012    [facility]  . Back surgery      LUMBAR-HERNIATED DISC  . Bunionectomy Bilateral   . Breast lumpectomy with sentinel lymph node biopsy Left 07/12/2015    Procedure: BREAST WIDE EXCISION WITH SENTINEL LYMPH  NODE BX, MASTOPLASTY ;  Surgeon: [name], MD;  Location: [facility] ORS;  Service: General;  Laterality: Left;  . Breast biopsy Left     benign  . Breast biopsy Left 06/16/2015    INVASIVE LOBULAR CARCINOMA  . Breast surgery Left 07/13/2015     Wide excision, mastoplasty, sentinel node biopsy.    FAMILY HISTORY Family History  Problem Relation Age of Onset  . Breast  cancer Sister 51    2016  . Breast cancer Paternal Aunt 53  . Hypertension Mother   . Diabetes Father   . Cancer Maternal Aunt 63    ovarian      ADVANCED DIRECTIVES: Patient does have advance healthcare directive, Patient   does not desire to make any changes   HEALTH MAINTENANCE: Social History  Substance Use Topics  . Smoking status: Never Smoker   . Smokeless tobacco: Never Used  . Alcohol Use: 0.0 oz/week    0 Standard drinks or equivalent per week     Comment: OCC      No Known Allergies  Current Outpatient Prescriptions  Medication Sig Dispense Refill  . ibuprofen (ADVIL,MOTRIN) 200 MG tablet Take 200 mg by mouth every 6 (six) hours as needed.    . Vitamin D, Ergocalciferol, (DRISDOL) 50000 UNITS CAPS capsule Take 50,000 Units by mouth every 7 (seven) days.      No current facility-administered medications for this visit.    OBJECTIVE: PHYSICAL EXAM: GENERAL:  Well developed, well nourished, sitting comfortably in the exam room in no acute distress. MENTAL STATUS:  Alert and oriented to person, place and time.  ENT:  Oropharynx clear without lesion.  Tongue normal. Mucous membranes moist.  RESPIRATORY:  Clear to auscultation without rales, wheezes or rhonchi. CARDIOVASCULAR:  Regular rate and rhythm without murmur, rub or gallop. BREAST:  Right breast without masses, skin changes or nipple discharge.  Left breast without masses, skin changes or nipple discharge. patient had ecchymosis left breast ABDOMEN:  Soft, non-tender, with active bowel sounds, and no hepatosplenomegaly.  No masses. BACK:  No CVA tenderness.  No tenderness on percussion of the back or rib cage. SKIN:  No rashes, ulcers or lesions. EXTREMITIES: No edema, no skin discoloration or tenderness.  No palpable cords. LYMPH NODES: No palpable cervical, supraclavicular, axillary or inguinal adenopathy  NEUROLOGICAL: Unremarkable. PSYCH:  Appropriate.  Filed Vitals:   10/09/15 1510  BP: 119/67   Pulse: 67  Temp: 96.7 F (35.9 C)     Body mass index is 23.44 kg/(m^2).    ECOG FS:0 - Asymptomatic  LAB RESULTS:  CBC Latest Ref Rng 10/03/2015 09/19/2015 09/21/2012  WBC 3.6 - 11.0 K/uL 3.9 4.0 6.2  Hemoglobin 12.0 - 16.0 g/dL 12.6 12.9 13.2  Hematocrit 35.0 - 47.0 % 37.7 38.4 38.7  Platelets 150 - 440 K/uL 266 259 296       ASSESSMENT:   Carcinoma of the left breast status post lumpectomy and sentinel lymph node evaluation Patient has finished radiation therapy in November of 2016 Now patient will start letrozole therapy Need for is getting calcium and vitamin D.  Side effect of increased chance of osteoporosis as well as joint pains had been discussed with the patient. Need for bone density study has been discussed Reevaluation in 6 months or before if patient has any problem Lifestyle changes on had been discussed to prevent incidence of breast cancer.     Patient expressed understanding and was in agreement with this plan. She also understands  that She can call clinic at any time with any questions, concerns, or complaints.    Breast cancer, female   Staging form: Breast, AJCC 7th Edition     Clinical: Stage IA (T1, N0, M0) - Signed by Forest Gleason, MD on 07/21/2015   Forest Gleason, MD   10/09/2015 3:37 PM

## 2015-10-15 ENCOUNTER — Encounter: Payer: Self-pay | Admitting: Oncology

## 2015-10-20 DIAGNOSIS — Z923 Personal history of irradiation: Secondary | ICD-10-CM

## 2015-10-20 HISTORY — DX: Personal history of irradiation: Z92.3

## 2015-10-25 ENCOUNTER — Ambulatory Visit: Payer: 59 | Admitting: General Surgery

## 2015-11-01 ENCOUNTER — Encounter: Payer: Self-pay | Admitting: General Surgery

## 2015-11-01 ENCOUNTER — Ambulatory Visit (INDEPENDENT_AMBULATORY_CARE_PROVIDER_SITE_OTHER): Payer: 59 | Admitting: General Surgery

## 2015-11-01 VITALS — BP 116/68 | HR 74 | Resp 12 | Ht 60.0 in | Wt 120.0 lb

## 2015-11-01 DIAGNOSIS — C50912 Malignant neoplasm of unspecified site of left female breast: Secondary | ICD-10-CM | POA: Diagnosis not present

## 2015-11-01 DIAGNOSIS — N62 Hypertrophy of breast: Secondary | ICD-10-CM | POA: Diagnosis not present

## 2015-11-01 DIAGNOSIS — N6092 Unspecified benign mammary dysplasia of left breast: Secondary | ICD-10-CM

## 2015-11-01 NOTE — Patient Instructions (Addendum)
The patient is aware to call back for any questions or concerns. May take letrozole at night

## 2015-11-01 NOTE — Progress Notes (Signed)
Patient ID: Lauren Mayo, female   DOB: 06-22-1956, 59 y.o.   MRN: [id number]  Chief Complaint  Patient presents with  . Follow-up    HPI Lauren Mayo is a 59 y.o. female.  Here today for follow up breast cancer and post left breast wide excision and SLN biopsy done on 07/12/15. She states that she is doing well. She states she has been having trouble staying asleep since taking the letrozole, she can tolerate the hot flashes. She does take it around lunchtime. She has not been on the medication for quite a month. She completed whole breast radiation on 10/09/2015.    HPI  Past Medical History  Diagnosis Date  . Arthritis     RIGHT LEG  . Family history of adverse reaction to anesthesia     SISTER-TROUBLE WAKING UP  . Hypercholesteremia   . BRCA negative 06-22-15    Dr Bary Castilla  . Cancer ([place])     melaoma   . Cancer of breast ([place]) 2016    INVASIVE LOBULAR CARCINOMA, T1b,N0. ER positive, PR negative, HER-2/neu not overexpressed.  . Melanoma [facility])     Past Surgical History  Procedure Laterality Date  . Abdominal hysterectomy  1990  . Leg skin lesion  biopsy / excision Left 2012,2015    melanoma  . Colonoscopy  2012    [facility]  . Back surgery      LUMBAR-HERNIATED DISC  . Bunionectomy Bilateral   . Breast lumpectomy with sentinel lymph node biopsy Left 07/12/2015    Procedure: BREAST WIDE EXCISION WITH SENTINEL LYMPH NODE BX, MASTOPLASTY ;  Surgeon: [name], MD;  Location: [facility] ORS;  Service: General;  Laterality: Left;  . Breast biopsy Left     benign  . Breast biopsy Left 06/16/2015    INVASIVE LOBULAR CARCINOMA  . Breast surgery Left 07/13/2015     Wide excision, mastoplasty, sentinel node biopsy.    Family History  Problem Relation Age of Onset  . Breast cancer Sister 55    2016  . Breast cancer Paternal Aunt 2  . Hypertension Mother   . Diabetes Father   . Cancer Maternal Aunt 63    ovarian    Social History Social History  Substance Use  Topics  . Smoking status: Never Smoker   . Smokeless tobacco: Never Used  . Alcohol Use: 0.0 oz/week    0 Standard drinks or equivalent per week     Comment: OCC    No Known Allergies  Current Outpatient Prescriptions  Medication Sig Dispense Refill  . Calcium-Magnesium-Vitamin D (CALCIUM 500 PO) Take 2 tablets by mouth daily.    Marland Kitchen ibuprofen (ADVIL,MOTRIN) 200 MG tablet Take 200 mg by mouth every 6 (six) hours as needed.    Marland Kitchen letrozole (FEMARA) 2.5 MG tablet Take 1 tablet (2.5 mg total) by mouth daily. 30 tablet 6  . Vitamin D, Ergocalciferol, (DRISDOL) 50000 UNITS CAPS capsule Take 50,000 Units by mouth every 7 (seven) days.      No current facility-administered medications for this visit.    Review of Systems Review of Systems  Constitutional: Negative.   Respiratory: Negative.   Cardiovascular: Negative.     Blood pressure 116/68, pulse 74, resp. rate 12, height 5' (1.524 m), weight 120 lb (54.432 kg).  Physical Exam Physical Exam  Constitutional: She is oriented to person, place, and time. She appears well-developed and well-nourished.  HENT:  Mouth/Throat: Oropharynx is clear and moist.  Eyes: Conjunctivae are normal. No  scleral icterus.  Neck: Neck supple.  Pulmonary/Chest:    Left breast incision well healed.  Lymphadenopathy:    She has no cervical adenopathy.  Neurological: She is alert and oriented to person, place, and time.  Skin: Skin is warm and dry.  Psychiatric: Her behavior is normal.    Data Reviewed Medical oncology notes of 10/09/2015. Patient has been recommended to have a bone density exam.  Assessment    Doing well status post wide excision/sentinel node/whole breast radiation.    Plan    The patient was advised that she is still on the adjustment phase to the antiestrogen therapy. She can change timing of the dose or simply wait to see if her difficulty with return to sleep persist.    She may try taking the letrozole at  night. Follow up in 3 months.   PCP:  Teena Irani 11/01/2015, 3:20 PM

## 2015-11-08 ENCOUNTER — Ambulatory Visit
Admission: RE | Admit: 2015-11-08 | Discharge: 2015-11-08 | Disposition: A | Discharge status: Home / Self Care | Payer: 59 | Source: Ambulatory Visit | Attending: Radiation Oncology | Admitting: Radiation Oncology

## 2015-11-08 ENCOUNTER — Encounter: Payer: Self-pay | Admitting: Radiation Oncology

## 2015-11-08 VITALS — BP 100/62 | HR 67 | Temp 97.6°F | Resp 20 | Wt 119.6 lb

## 2015-11-08 DIAGNOSIS — C50912 Malignant neoplasm of unspecified site of left female breast: Secondary | ICD-10-CM

## 2015-11-08 NOTE — Progress Notes (Signed)
Radiation Oncology Follow up Note  Name: Lauren Mayo   Date:   11/08/2015 MRN:  [id number] DOB: [birth year]    This 59 y.o. female presents to the clinic today for follow-up for breast cancer stage TIb lobular carcinoma ER/PR positive status post whole breast radiation therapy.  REFERRING PROVIDER: [name], MD  HPI: Patient is a 59 year old female now 1 month out having completed whole breast radiation of her left breast for lobular carcinoma. Tumor was positive PR negative. Patient don't wide local excision and sentinel node biopsy then underwent whole breast radiation. She is now out 1 month and doing well she specifically denies breast tenderness cough or bone pain. She has been taking letrozole although she states his increasing her hot flashes as well as causing some insomnia.  COMPLICATIONS OF TREATMENT: none  FOLLOW UP COMPLIANCE: keeps appointments   PHYSICAL EXAM:  BP 100/62 mmHg  Pulse 67  Temp(Src) 97.6 F (36.4 C)  Resp 20  Wt 119 lb 9.6 oz (54.25 kg) Lungs are clear to A&P cardiac examination essentially unremarkable with regular rate and rhythm. No dominant mass or nodularity is noted in either breast in 2 positions examined. Incision is well-healed. No axillary or supraclavicular adenopathy is appreciated. Cosmetic result is excellent. Well-developed well-nourished patient in NAD. HEENT reveals PERLA, EOMI, discs not visualized.  Oral cavity is clear. No oral mucosal lesions are identified. Neck is clear without evidence of cervical or supraclavicular adenopathy. Lungs are clear to A&P. Cardiac examination is essentially unremarkable with regular rate and rhythm without murmur rub or thrill. Abdomen is benign with no organomegaly or masses noted. Motor sensory and DTR levels are equal and symmetric in the upper and lower extremities. Cranial nerves II through XII are grossly intact. Proprioception is intact. No peripheral adenopathy or edema is identified. No  motor or sensory levels are noted. Crude visual fields are within normal range.  RADIOLOGY RESULTS: No current films for review  PLAN: Present time she is doing well I've suggested some vitamin E supplements for her hot flashes. Otherwise she continues to do well with no evidence of disease now 1 month out. I am please were overall progress. I've asked to see her back in 4-5 months for follow-up. She continues close follow-up care with surgeon and medical oncology. She knows to call with any concerns.  I would like to take this opportunity for allowing me to participate in the care of your patient.Armstead Peaks., MD

## 2015-11-30 ENCOUNTER — Telehealth: Payer: Self-pay | Admitting: *Deleted

## 2015-11-30 DIAGNOSIS — C50912 Malignant neoplasm of unspecified site of left female breast: Secondary | ICD-10-CM

## 2015-11-30 MED ORDER — LETROZOLE 2.5 MG PO TABS: 2.5000 mg | ORAL_TABLET | Freq: Every day | ORAL | Status: DC

## 2015-11-30 NOTE — Telephone Encounter (Signed)
Insurance specifying she [facility]. [facility] and 90 day supply sent to Mail order

## 2016-01-22 ENCOUNTER — Encounter: Payer: Self-pay | Admitting: General Surgery

## 2016-01-22 ENCOUNTER — Ambulatory Visit (INDEPENDENT_AMBULATORY_CARE_PROVIDER_SITE_OTHER): Payer: 59 | Admitting: General Surgery

## 2016-01-22 VITALS — BP 110/66 | HR 68 | Resp 12 | Ht 60.0 in | Wt 119.0 lb

## 2016-01-22 DIAGNOSIS — C50912 Malignant neoplasm of unspecified site of left female breast: Secondary | ICD-10-CM | POA: Insufficient documentation

## 2016-01-22 DIAGNOSIS — C50312 Malignant neoplasm of lower-inner quadrant of left female breast: Secondary | ICD-10-CM

## 2016-01-22 NOTE — Progress Notes (Signed)
Patient ID: Lauren Mayo, female   DOB: 03-14-1956, 60 y.o.   MRN: [id number]  No chief complaint on file.   HPI Lauren Mayo is a 60 y.o. female.  Here for 3 month f/u of left breast cancer.  No fevers, redness, pain.   Some sleep problems, but generally improved. She'll have several good bites of sleep, and then have an eye towards difficult to get to sleep. She makes use of Benadryl at this time. She is experiencing rare nocturnal hot flashes.  Bone density was completed with her PCP and did show osteopenia. She is making use of calcium as well as vitamin D supplements.  I personally reviewed the patient's history. HPI  Past Medical History  Diagnosis Date  . Arthritis     RIGHT LEG  . Family history of adverse reaction to anesthesia     SISTER-TROUBLE WAKING UP  . Hypercholesteremia   . BRCA negative 06-22-15    Dr Bary Castilla  . Cancer ([place])     melaoma   . Cancer of breast ([place]) 2016    INVASIVE LOBULAR CARCINOMA, T1b,N0. ER positive, PR negative, HER-2/neu not overexpressed.  . Melanoma [facility])     Past Surgical History  Procedure Laterality Date  . Abdominal hysterectomy  1990  . Leg skin lesion  biopsy / excision Left 2012,2015    melanoma  . Colonoscopy  2012    [facility]  . Back surgery      LUMBAR-HERNIATED DISC  . Bunionectomy Bilateral   . Breast lumpectomy with sentinel lymph node biopsy Left 07/12/2015    Procedure: BREAST WIDE EXCISION WITH SENTINEL LYMPH NODE BX, MASTOPLASTY ;  Surgeon: [name], MD;  Location: [facility] ORS;  Service: General;  Laterality: Left;  . Breast biopsy Left     benign  . Breast biopsy Left 06/16/2015    INVASIVE LOBULAR CARCINOMA  . Breast surgery Left 07/13/2015     Wide excision, mastoplasty, sentinel node biopsy.    Family History  Problem Relation Age of Onset  . Breast cancer Sister 40    2016  . Breast cancer Paternal Aunt 69  . Hypertension Mother   . Diabetes Father   . Cancer Maternal Aunt 63    ovarian     Social History Social History  Substance Use Topics  . Smoking status: Never Smoker   . Smokeless tobacco: Never Used  . Alcohol Use: 0.0 oz/week    0 Standard drinks or equivalent per week     Comment: OCC    No Known Allergies  Current Outpatient Prescriptions  Medication Sig Dispense Refill  . Calcium-Magnesium-Vitamin D (CALCIUM 500 PO) Take 2 tablets by mouth daily.    Marland Kitchen ibuprofen (ADVIL,MOTRIN) 200 MG tablet Take 200 mg by mouth every 6 (six) hours as needed.    Marland Kitchen letrozole (FEMARA) 2.5 MG tablet Take 1 tablet (2.5 mg total) by mouth daily. 90 tablet 1  . Vitamin D, Ergocalciferol, (DRISDOL) 50000 UNITS CAPS capsule Take 50,000 Units by mouth every 7 (seven) days.     . vitamin E 400 UNIT capsule Take 400 Units by mouth 2 (two) times daily.     No current facility-administered medications for this visit.    Review of Systems Review of Systems  Constitutional: Negative.   Respiratory: Negative.   Cardiovascular: Negative.     Blood pressure 110/66, pulse 68, resp. rate 12, height 5' (1.524 m), weight 119 lb (53.978 kg).  Physical Exam Physical Exam  Constitutional: She  is oriented to person, place, and time. She appears well-developed and well-nourished.  HENT:  Head: Normocephalic.  Eyes: Conjunctivae are normal. No scleral icterus.  Neck: Neck supple.  Cardiovascular: Normal rate, regular rhythm and normal heart sounds.   Pulmonary/Chest: Effort normal and breath sounds normal.    Lungs fine.  Both breasts fine.  No soreness.  Abdominal: Soft. Bowel sounds are normal. There is no tenderness.  Musculoskeletal: Normal range of motion.  Lymphadenopathy:    She has no cervical adenopathy.    She has no axillary adenopathy.       Right: No supraclavicular adenopathy present.       Left: No supraclavicular adenopathy present.  Neurological: She is alert and oriented to person, place, and time.  Skin: Skin is warm and dry.  Psychiatric: She has a normal mood  and affect. Her behavior is normal. Judgment and thought content normal.    Data Reviewed [facility]- DEXA Report    REFERRING MD:   Doy Hutching                                               TECHNICIAN:    Clearence Ped HISTORY:  FOLLOW UP BONE DENSITY. 60 YEAR OLD WHITE FEMALE. S/P LEFT  SHOULDER FRACTURE.  SITE DATE BMD g/cm2  T-SCORE Z-SCORE g/cm2 CHANGE % CHANGE  STATISTICALLY               SIGNIFICANT?  LUMBAR SPINE L1- L4 09/30/13 0.975 -0.7        10/30/15 0.972 -0.7  -0.003 -0.3% NO  HIP R.FEM.NECK 09/30/13 0.669 -1.6        10/30/15 0.662 -1.7  -0.007 -1.1% NO   R.TOTAL HIP 09/30/13 0.856 -0.7        10/30/15 0.838 -0.9  -0.018 -2.1% NO  FOREARM          L/R 33%           INTERPRETATION:   Osteopenia/Low bone mass.  No significant change from  09/2013.   FRACTURE RISK ASSESSMENT (FRAX) _1.4____  10 year risk of hip fracture.      __14___  10 year risk of any  major fracture.   TREATMENT:  The National Osteoporosis Foundation (2008) recommends treatment for:  Patients with hip or vertebral fracture (clinical or morphometric) Patients with osteoporosis as defined by T score < = -2.5 Postmenopausal women or men age 58 and older with low bone mass (T score  -1.0 to -2.5, osteopenia) at the femoral neck, total hip, or spine and 10  year hip fracture risk probability >3% or a 10 year all major osteoporosis  related fracture probability of >20%. BMD should be monitored two years after initiating therapy and at two-year  intervals thereafter.  _______________________________________ A. Lavone Orn, MD          Certified Clinical Densitometrist  [facility] [place] is a [employer] QDR 4500. [facility] facility and technologist Least Significant Change [facility]): Technologist  Site LS Spine   Site Hip S G     0.031 g/cm2    0.040 g/cm2 L R    0.054 g/cm2   0.033 g/cm2 at 95% confidence level    Assessment    Doing well 6 months status post wide  excision/mastoplasty/sentinel node biopsy for a T1b, N0 breast cancer.    Plan  Weightbearing exercise encouraged to improve bone density.   Follow-up in 6 months with bilateral diagnostic mammogram.   PCP:  Felipa Furnace Scribed by Addison Lank 01/22/2016, 9:43 AM

## 2016-01-22 NOTE — Patient Instructions (Signed)
Follow-up in 6 months with bilateral diagnostic mammogram.

## 2016-04-03 ENCOUNTER — Encounter: Payer: Self-pay | Admitting: *Deleted

## 2016-04-03 ENCOUNTER — Ambulatory Visit
Admission: RE | Admit: 2016-04-03 | Discharge: 2016-04-03 | Disposition: A | Discharge status: Home / Self Care | Payer: 59 | Source: Ambulatory Visit | Attending: Radiation Oncology | Admitting: Radiation Oncology

## 2016-04-03 ENCOUNTER — Encounter: Payer: Self-pay | Admitting: Radiation Oncology

## 2016-04-03 VITALS — BP 103/60 | HR 73 | Temp 97.6°F | Resp 20 | Wt 118.7 lb

## 2016-04-03 DIAGNOSIS — C50212 Malignant neoplasm of upper-inner quadrant of left female breast: Secondary | ICD-10-CM

## 2016-04-03 NOTE — Progress Notes (Signed)
.  Radiation Oncology Follow up Note  Name: Lauren Mayo   Date:   04/03/2016 MRN:  [id number] DOB: March 03, 1956    This 60 y.o. female presents to the clinic today for six-month follow-up status post whole breast radiation for lobular carcinoma of her left breast ER/PR positive PR negative.  REFERRING PROVIDER: [name], MD  HPI: Patient is a 60 year old female now out 6 months having completed radiation to her left breast for lobular carcinoma ER positive PR negative status post wide local excision and sentinel node biopsy. She is seen today in routine follow-up and is doing well currently on letrozole tolerating that well without side effect. She specifically denies breast tenderness cough or bone pain..  COMPLICATIONS OF TREATMENT: none  FOLLOW UP COMPLIANCE: keeps appointments   PHYSICAL EXAM:  BP 103/60 mmHg  Pulse 73  Temp(Src) 97.6 F (36.4 C)  Resp 20  Wt 118 lb 11.5 oz (53.85 kg) Lungs are clear to A&P cardiac examination essentially unremarkable with regular rate and rhythm. No dominant mass or nodularity is noted in either breast in 2 positions examined. Incision is well-healed. No axillary or supraclavicular adenopathy is appreciated. Cosmetic result is excellent. Well-developed well-nourished patient in NAD. HEENT reveals PERLA, EOMI, discs not visualized.  Oral cavity is clear. No oral mucosal lesions are identified. Neck is clear without evidence of cervical or supraclavicular adenopathy. Lungs are clear to A&P. Cardiac examination is essentially unremarkable with regular rate and rhythm without murmur rub or thrill. Abdomen is benign with no organomegaly or masses noted. Motor sensory and DTR levels are equal and symmetric in the upper and lower extremities. Cranial nerves II through XII are grossly intact. Proprioception is intact. No peripheral adenopathy or edema is identified. No motor or sensory levels are noted. Crude visual fields are within normal  range.  RADIOLOGY RESULTS: Follow-up mammograms have been scheduled and then in the next month  PLAN: Present time she continues to do well with no evidence of disease. I'm please were overall progress. I've asked to see her back in 6 months for follow-up and then will stop once your follow-up visits. She continues on anastrozole. She is a rescheduled for follow-up mammograms. Will review those when they become available. Patient knows to call sooner with any concerns.  I would like to take this opportunity for allowing me to participate in the care of your patient.Armstead Peaks., MD

## 2016-04-08 ENCOUNTER — Other Ambulatory Visit: Payer: 59

## 2016-04-08 ENCOUNTER — Ambulatory Visit: Payer: 59 | Admitting: Oncology

## 2016-04-16 DIAGNOSIS — E559 Vitamin D deficiency, unspecified: Secondary | ICD-10-CM | POA: Insufficient documentation

## 2016-04-18 ENCOUNTER — Inpatient Hospital Stay (HOSPITAL_BASED_OUTPATIENT_CLINIC_OR_DEPARTMENT_OTHER): Payer: 59 | Admitting: Family Medicine

## 2016-04-18 ENCOUNTER — Inpatient Hospital Stay: Payer: 59 | Attending: Family Medicine

## 2016-04-18 ENCOUNTER — Other Ambulatory Visit: Payer: 59

## 2016-04-18 ENCOUNTER — Ambulatory Visit: Payer: 59 | Admitting: Oncology

## 2016-04-18 VITALS — BP 98/66 | HR 83 | Temp 97.1°F | Resp 16 | Wt 118.8 lb

## 2016-04-18 DIAGNOSIS — Z803 Family history of malignant neoplasm of breast: Secondary | ICD-10-CM

## 2016-04-18 DIAGNOSIS — Z79811 Long term (current) use of aromatase inhibitors: Secondary | ICD-10-CM

## 2016-04-18 DIAGNOSIS — R5383 Other fatigue: Secondary | ICD-10-CM

## 2016-04-18 DIAGNOSIS — Z79899 Other long term (current) drug therapy: Secondary | ICD-10-CM

## 2016-04-18 DIAGNOSIS — Z170 Estrogen receptor positive status [ER+]: Secondary | ICD-10-CM | POA: Insufficient documentation

## 2016-04-18 DIAGNOSIS — E7800 Pure hypercholesterolemia, unspecified: Secondary | ICD-10-CM | POA: Insufficient documentation

## 2016-04-18 DIAGNOSIS — C50312 Malignant neoplasm of lower-inner quadrant of left female breast: Secondary | ICD-10-CM | POA: Insufficient documentation

## 2016-04-18 DIAGNOSIS — C50912 Malignant neoplasm of unspecified site of left female breast: Secondary | ICD-10-CM

## 2016-04-18 DIAGNOSIS — M1990 Unspecified osteoarthritis, unspecified site: Secondary | ICD-10-CM

## 2016-04-18 DIAGNOSIS — Z85820 Personal history of malignant melanoma of skin: Secondary | ICD-10-CM

## 2016-04-18 DIAGNOSIS — Z923 Personal history of irradiation: Secondary | ICD-10-CM | POA: Diagnosis not present

## 2016-04-18 LAB — CBC WITH DIFFERENTIAL/PLATELET
Basophils Absolute: 0 10*3/uL (ref 0–0.1)
Basophils Relative: 1 %
EOS PCT: 3 %
Eosinophils Absolute: 0.1 10*3/uL (ref 0–0.7)
HCT: 38.9 % (ref 35.0–47.0)
HEMOGLOBIN: 13.4 g/dL (ref 12.0–16.0)
LYMPHS ABS: 1.4 10*3/uL (ref 1.0–3.6)
LYMPHS PCT: 26 %
MCH: 29.4 pg (ref 26.0–34.0)
MCHC: 34.5 g/dL (ref 32.0–36.0)
MCV: 85.3 fL (ref 80.0–100.0)
MONO ABS: 0.2 10*3/uL (ref 0.2–0.9)
Monocytes Relative: 4 %
Neutro Abs: 3.7 10*3/uL (ref 1.4–6.5)
Neutrophils Relative %: 66 %
PLATELETS: 248 10*3/uL (ref 150–440)
RBC: 4.55 MIL/uL (ref 3.80–5.20)
RDW: 13.6 % (ref 11.5–14.5)
WBC: 5.6 10*3/uL (ref 3.6–11.0)

## 2016-04-18 LAB — COMPREHENSIVE METABOLIC PANEL
ALK PHOS: 71 U/L (ref 38–126)
ALT: 19 U/L (ref 14–54)
ANION GAP: 5 (ref 5–15)
AST: 20 U/L (ref 15–41)
Albumin: 4.7 g/dL (ref 3.5–5.0)
BUN: 15 mg/dL (ref 6–20)
CALCIUM: 9.4 mg/dL (ref 8.9–10.3)
CO2: 29 mmol/L (ref 22–32)
CREATININE: 0.76 mg/dL (ref 0.44–1.00)
Chloride: 103 mmol/L (ref 101–111)
Glucose, Bld: 106 mg/dL — ABNORMAL HIGH (ref 65–99)
Potassium: 4.2 mmol/L (ref 3.5–5.1)
SODIUM: 137 mmol/L (ref 135–145)
Total Bilirubin: 0.6 mg/dL (ref 0.3–1.2)
Total Protein: 7 g/dL (ref 6.5–8.1)

## 2016-04-18 NOTE — Progress Notes (Signed)
Pt here for her 6 month evaluation of her Breast Cancer. Las mammogram was in July 2016. Sentinal node surgery in Aug 2016. Tolerating femara without difficulty. Initially had some trouble sleeping after initiating drug. Appetite is fairly good. Energy is fair. Exercise used to give her energy now it makes her fatigued.

## 2016-04-18 NOTE — Progress Notes (Signed)
[place]  [phone]  [phone]     Lauren Mayo DOB: [birth year]  MR#: [id number]  OYD#:[id number]  Patient Care Team: [name], MD as PCP - General (Internal Medicine) [name], MD (Internal Medicine) [name], MD (General Surgery)  CHIEF COMPLAINT:  Chief Complaint  Patient presents with  . Follow-up    Breast Ca    INTERVAL HISTORY:  Patient is here for further follow-up regarding carcinoma of the breast. Patient was originally diagnosed in 2016 and has completed radiation therapy following a lumpectomy and sentinel lymph node biopsy. She is currently on letrozole and tolerating well. She is also taking calcium and vitamin D daily. She is due for her next follow-up mammogram in July 2017. Patient reports that she has continued exercising but has some intermittent fatigue. Patient overall reports feeling very well and denies any acute complaints.  REVIEW OF SYSTEMS:   Review of Systems  Constitutional: Negative for fever, chills, weight loss, malaise/fatigue and diaphoresis.  HENT: Negative.   Eyes: Negative.   Respiratory: Negative for cough, hemoptysis, sputum production, shortness of breath and wheezing.   Cardiovascular: Negative for chest pain, palpitations, orthopnea, claudication, leg swelling and PND.  Gastrointestinal: Negative for heartburn, nausea, vomiting, abdominal pain, diarrhea, constipation, blood in stool and melena.  Genitourinary: Negative.   Musculoskeletal: Negative.   Skin: Negative.   Neurological: Negative for dizziness, tingling, focal weakness, seizures and weakness.  Endo/Heme/Allergies: Does not bruise/bleed easily.  Psychiatric/Behavioral: Negative for depression. The patient is not nervous/anxious and does not have insomnia.     As per HPI. Otherwise, a complete review of systems is negatve.  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 Arthritis     RIGHT LEG  . Family history of adverse reaction to anesthesia     SISTER-TROUBLE WAKING UP  . Hypercholesteremia   . BRCA negative 06-22-15    Dr Bary Castilla  . Cancer ([place])     melaoma   . Cancer of breast ([place]) 2016    INVASIVE LOBULAR CARCINOMA, T1b,N0. ER positive, PR negative, HER-2/neu not overexpressed.  . Melanoma ([place])     PAST SURGICAL HISTORY: Past Surgical History  Procedure Laterality Date  . Abdominal hysterectomy  1990  . Leg skin lesion  biopsy / excision Left 2012,2015    melanoma  . Colonoscopy  2012    [facility]  . Back surgery      LUMBAR-HERNIATED DISC  . Bunionectomy Bilateral   . Breast lumpectomy with sentinel lymph node biopsy Left 07/12/2015    Procedure: BREAST WIDE EXCISION WITH SENTINEL LYMPH NODE BX, MASTOPLASTY ;  Surgeon: [name], MD;  Location: [facility] ORS;  Service: General;  Laterality: Left;  . Breast biopsy Left     benign  . Breast biopsy Left 06/16/2015    INVASIVE LOBULAR CARCINOMA  . Breast surgery Left 07/13/2015     Wide excision, mastoplasty, sentinel node biopsy.    FAMILY HISTORY Family History  Problem Relation Age of Onset  . Breast cancer Sister 45  2016  . Breast cancer Paternal Aunt 36  . Hypertension Mother   . Diabetes Father   . Cancer Maternal Aunt 63    ovarian    GYNECOLOGIC HISTORY:  No LMP recorded. Patient has had a hysterectomy.     ADVANCED DIRECTIVES:    HEALTH MAINTENANCE: Social History  Substance Use Topics  . Smoking status: Never Smoker   . Smokeless tobacco: Never Used  . Alcohol Use: 0.0 oz/week    0 Standard  drinks or equivalent per week     Comment: OCC     Colonoscopy:  Bone density:  Mammogram:July 2017  No Known Allergies  Current Outpatient Prescriptions  Medication Sig Dispense Refill  . Calcium-Magnesium-Vitamin D (CALCIUM 500 PO) Take 2 tablets by mouth daily.    Marland Kitchen ibuprofen (ADVIL,MOTRIN) 200 MG tablet Take 200 mg by mouth every 6 (six) hours as needed.    Marland Kitchen letrozole (FEMARA) 2.5 MG tablet Take 1 tablet (2.5 mg total) by mouth daily. 90 tablet 1  . Vitamin D, Ergocalciferol, (DRISDOL) 50000 UNITS CAPS capsule Take 50,000 Units by mouth every 7 (seven) days.     . vitamin E 400 UNIT capsule Take 400 Units by mouth 2 (two) times daily.     No current facility-administered medications for this visit.    OBJECTIVE: BP 98/66 mmHg  Pulse 83  Temp(Src) 97.1 F (36.2 C) (Tympanic)  Resp 16  Wt 118 lb 13.3 oz (53.9 kg)   Body mass index is 23.21 kg/(m^2).    ECOG FS:0 - Asymptomatic  General: Well-developed, well-nourished, no acute distress. Eyes: Pink conjunctiva, anicteric sclera. HEENT: Normocephalic, moist mucous membranes, clear oropharnyx. Lungs: Clear to auscultation bilaterally. Heart: Regular rate and rhythm. No rubs, murmurs, or gallops. Abdomen: Soft, nontender, nondistended. No organomegaly noted, normoactive bowel sounds. Breast: Breast palpated in a circular manner in the sitting and supine positions.  No masses or fullness palpated.  Axilla palpated in both positions with no masses or fullness palpated.  Musculoskeletal: No edema, cyanosis, or clubbing. Neuro: Alert, answering all questions appropriately. Cranial nerves grossly intact. Skin: No rashes or petechiae noted. Psych: Normal affect. Lymphatics: No cervical, clavicular, or axillary LAD.   LAB RESULTS:  Appointment on 04/18/2016  Component Date Value Ref Range Status  . WBC 04/18/2016 5.6  3.6 - 11.0 K/uL Final  . RBC 04/18/2016 4.55  3.80 - 5.20 MIL/uL Final  . Hemoglobin 04/18/2016 13.4  12.0 -  16.0 g/dL Final  . HCT 04/18/2016 38.9  35.0 - 47.0 % Final  . MCV 04/18/2016 85.3  80.0 - 100.0 fL Final  . MCH 04/18/2016 29.4  26.0 - 34.0 pg Final  . MCHC 04/18/2016 34.5  32.0 - 36.0 g/dL Final  . RDW 04/18/2016 13.6  11.5 - 14.5 % Final  . Platelets 04/18/2016 248  150 - 440 K/uL Final  . Neutrophils Relative % 04/18/2016 66   Final  . Neutro Abs 04/18/2016 3.7  1.4 - 6.5 K/uL Final  . Lymphocytes Relative 04/18/2016 26   Final  . Lymphs Abs 04/18/2016 1.4  1.0 - 3.6 K/uL Final  . Monocytes Relative 04/18/2016 4   Final  . Monocytes Absolute 04/18/2016 0.2  0.2 - 0.9 K/uL Final  . Eosinophils Relative 04/18/2016 3   Final  . Eosinophils Absolute 04/18/2016 0.1  0 - 0.7 K/uL Final  . Basophils Relative 04/18/2016 1   Final  . Basophils Absolute 04/18/2016 0.0  0 - 0.1 K/uL Final  . Sodium 04/18/2016 137  135 - 145 mmol/L Final  . Potassium 04/18/2016 4.2  3.5 - 5.1 mmol/L Final  . Chloride 04/18/2016 103  101 - 111 mmol/L Final  . CO2 04/18/2016 29  22 - 32 mmol/L Final  . Glucose, Bld 04/18/2016 106* 65 - 99 mg/dL Final  . BUN 04/18/2016 15  6 - 20 mg/dL Final  . Creatinine, Ser 04/18/2016 0.76  0.44 - 1.00 mg/dL Final  . Calcium 04/18/2016 9.4  8.9 - 10.3 mg/dL Final  . Total Protein 04/18/2016 7.0  6.5 - 8.1 g/dL Final  . Albumin 04/18/2016 4.7  3.5 - 5.0 g/dL Final  . AST 04/18/2016 20  15 - 41 U/L Final  . ALT 04/18/2016 19  14 - 54 U/L Final  . Alkaline Phosphatase 04/18/2016 71  38 - 126 U/L Final  . Total Bilirubin 04/18/2016 0.6  0.3 - 1.2 mg/dL Final  . GFR calc non Af Amer 04/18/2016 >60  >60 mL/min Final  . GFR calc Af Amer 04/18/2016 >60  >60 mL/min Final   Comment: (NOTE) The eGFR has been calculated using the CKD EPI equation. This calculation has not been validated in all clinical situations. eGFR's persistently <60 mL/min signify possible Chronic Kidney Disease.   . Anion gap 04/18/2016 5  5 - 15 Final    STUDIES: No results found.  ASSESSMENT:    Carcinoma of the left breast, stage I a, T1 N0 M0, ER positive, PR negative, HER-2/neu negative.  PLAN:   1. Left breast cancer. Patient originally diagnosed in July 2016 and is status post lumpectomy, sentinel lymph node biopsy which was negative, as well as radiation therapy that was completed in November 2016. Patient is currently on letrozole daily as well as calcium and vitamin D. She is tolerating treatment very well. Clinically there is no evidence of recurrent disease. Her next mammogram should be scheduled in July 2017. Patient to return in approximately 6 months for continued follow-up.  Patient expressed understanding and was in agreement with this plan. She also understands that She can call clinic at any time with any questions, concerns, or complaints.   Dr. Oliva Bustard was available for consultation and review of plan of care for this patient.  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

## 2016-04-25 ENCOUNTER — Other Ambulatory Visit: Payer: Self-pay

## 2016-04-25 DIAGNOSIS — C50312 Malignant neoplasm of lower-inner quadrant of left female breast: Secondary | ICD-10-CM

## 2016-07-02 ENCOUNTER — Ambulatory Visit
Admission: RE | Admit: 2016-07-02 | Discharge: 2016-07-02 | Disposition: A | Discharge status: Home / Self Care | Payer: 59 | Source: Ambulatory Visit | Attending: General Surgery | Admitting: General Surgery

## 2016-07-02 ENCOUNTER — Other Ambulatory Visit: Payer: Self-pay | Admitting: *Deleted

## 2016-07-02 ENCOUNTER — Other Ambulatory Visit: Payer: Self-pay | Admitting: General Surgery

## 2016-07-02 ENCOUNTER — Telehealth: Payer: Self-pay | Admitting: *Deleted

## 2016-07-02 DIAGNOSIS — C50312 Malignant neoplasm of lower-inner quadrant of left female breast: Secondary | ICD-10-CM

## 2016-07-02 NOTE — Telephone Encounter (Signed)
  Oncology Nurse Navigator Documentation  Navigator Location: [facility] (07/02/16 1400) Navigator Encounter Type: Telephone (07/02/16 1400)             Treatment Phase: Follow-up (07/02/16 1400) Barriers/Navigation Needs: Coordination of Care (07/02/16 1400)   Interventions: Coordination of Care (07/02/16 1400)   Coordination of Care: Other (07/02/16 1400)                  Time Spent with Patient: 30 (07/02/16 1400)   Patient called and complains of feeling very fatigued.  States she has been taking Letrozole since November and experienced some side effects the first month, such as sleeplessness and leg pain, which has all subsided now.  She is concerned since this has been a new finding for her over the last 2 months.  States she has always exercised, and continues to walk 40 minutes each morning, but by the afternoon, she is so tired physically and mentally, that she has to lay down and rest.  States she can't even concentrate on her writing.  Informed patient that I would discuss this with Dr. Rogue Bussing and get back to her.  She is agreeable.

## 2016-07-02 NOTE — Telephone Encounter (Signed)
Talked with Dr. Rogue Bussing.  Orders for CBC and CMP placed.  Patient notified of appointment for labs on 07/03/16 @ 11:45.  He will give recommendation after reviewing labs.

## 2016-07-03 ENCOUNTER — Telehealth: Payer: Self-pay | Admitting: Internal Medicine

## 2016-07-03 ENCOUNTER — Inpatient Hospital Stay: Payer: 59 | Attending: Internal Medicine

## 2016-07-03 DIAGNOSIS — Z780 Asymptomatic menopausal state: Secondary | ICD-10-CM | POA: Diagnosis not present

## 2016-07-03 DIAGNOSIS — Z923 Personal history of irradiation: Secondary | ICD-10-CM | POA: Diagnosis not present

## 2016-07-03 DIAGNOSIS — Z79899 Other long term (current) drug therapy: Secondary | ICD-10-CM | POA: Insufficient documentation

## 2016-07-03 DIAGNOSIS — E7800 Pure hypercholesterolemia, unspecified: Secondary | ICD-10-CM | POA: Insufficient documentation

## 2016-07-03 DIAGNOSIS — R5381 Other malaise: Secondary | ICD-10-CM | POA: Diagnosis not present

## 2016-07-03 DIAGNOSIS — M1990 Unspecified osteoarthritis, unspecified site: Secondary | ICD-10-CM | POA: Diagnosis not present

## 2016-07-03 DIAGNOSIS — Z85820 Personal history of malignant melanoma of skin: Secondary | ICD-10-CM | POA: Diagnosis not present

## 2016-07-03 DIAGNOSIS — Z170 Estrogen receptor positive status [ER+]: Secondary | ICD-10-CM | POA: Diagnosis not present

## 2016-07-03 DIAGNOSIS — R5383 Other fatigue: Secondary | ICD-10-CM | POA: Diagnosis not present

## 2016-07-03 DIAGNOSIS — R531 Weakness: Secondary | ICD-10-CM | POA: Insufficient documentation

## 2016-07-03 DIAGNOSIS — Z79811 Long term (current) use of aromatase inhibitors: Secondary | ICD-10-CM | POA: Diagnosis not present

## 2016-07-03 DIAGNOSIS — Z803 Family history of malignant neoplasm of breast: Secondary | ICD-10-CM | POA: Insufficient documentation

## 2016-07-03 DIAGNOSIS — R0602 Shortness of breath: Secondary | ICD-10-CM | POA: Diagnosis not present

## 2016-07-03 DIAGNOSIS — C50312 Malignant neoplasm of lower-inner quadrant of left female breast: Secondary | ICD-10-CM | POA: Diagnosis not present

## 2016-07-03 LAB — CBC WITH DIFFERENTIAL/PLATELET
BASOS ABS: 0.1 10*3/uL (ref 0–0.1)
Basophils Relative: 1 %
Eosinophils Absolute: 0.2 10*3/uL (ref 0–0.7)
Eosinophils Relative: 4 %
HEMATOCRIT: 38.5 % (ref 35.0–47.0)
HEMOGLOBIN: 13.1 g/dL (ref 12.0–16.0)
LYMPHS PCT: 26 %
Lymphs Abs: 1.4 10*3/uL (ref 1.0–3.6)
MCH: 28.9 pg (ref 26.0–34.0)
MCHC: 34 g/dL (ref 32.0–36.0)
MCV: 85 fL (ref 80.0–100.0)
MONO ABS: 0.2 10*3/uL (ref 0.2–0.9)
Monocytes Relative: 4 %
NEUTROS ABS: 3.6 10*3/uL (ref 1.4–6.5)
NEUTROS PCT: 65 %
Platelets: 271 10*3/uL (ref 150–440)
RBC: 4.53 MIL/uL (ref 3.80–5.20)
RDW: 13.9 % (ref 11.5–14.5)
WBC: 5.5 10*3/uL (ref 3.6–11.0)

## 2016-07-03 LAB — COMPREHENSIVE METABOLIC PANEL
ALBUMIN: 4.6 g/dL (ref 3.5–5.0)
ALK PHOS: 67 U/L (ref 38–126)
ALT: 16 U/L (ref 14–54)
AST: 19 U/L (ref 15–41)
Anion gap: 6 (ref 5–15)
BILIRUBIN TOTAL: 0.5 mg/dL (ref 0.3–1.2)
BUN: 12 mg/dL (ref 6–20)
CALCIUM: 9.2 mg/dL (ref 8.9–10.3)
CO2: 29 mmol/L (ref 22–32)
CREATININE: 0.87 mg/dL (ref 0.44–1.00)
Chloride: 104 mmol/L (ref 101–111)
GFR calc Af Amer: >60 mL/min (ref >60)
GLUCOSE: 83 mg/dL (ref 65–99)
Potassium: 3.9 mmol/L (ref 3.5–5.1)
Sodium: 139 mmol/L (ref 135–145)
TOTAL PROTEIN: 7.3 g/dL (ref 6.5–8.1)

## 2016-07-03 NOTE — Telephone Encounter (Signed)
Lauren Mayo- please inform patient that all her labs are within normal limits. Labs do not explain her symptoms. Possible- that her symptoms are from letrozole. Okay to hold it for a month- follow-up with the next provider. GB

## 2016-07-04 ENCOUNTER — Telehealth: Payer: Self-pay | Admitting: *Deleted

## 2016-07-04 NOTE — Telephone Encounter (Signed)
  Oncology Nurse Navigator Documentation  Navigator Location: [facility] (07/04/16 1200) Navigator Encounter Type: Telephone (07/04/16 1200)             Treatment Phase: Follow-up (07/04/16 1200) Barriers/Navigation Needs: Coordination of Care (07/04/16 1200)   Interventions: Coordination of Care (07/04/16 1200)   Coordination of Care: Other (07/04/16 1200)                  Time Spent with Patient: 30 (07/04/16 1200)   Called patient to inform her of her nomal labs and need to stop Letrozole for one month, to see if her symptoms resolve.  She is to call me back and let me know if she is feeling better by the end of the month.  Patient is agreeable to the plan.

## 2016-07-16 ENCOUNTER — Ambulatory Visit: Payer: 59 | Admitting: General Surgery

## 2016-07-24 ENCOUNTER — Ambulatory Visit (INDEPENDENT_AMBULATORY_CARE_PROVIDER_SITE_OTHER): Payer: 59 | Admitting: General Surgery

## 2016-07-24 ENCOUNTER — Encounter: Payer: Self-pay | Admitting: General Surgery

## 2016-07-24 VITALS — BP 112/70 | HR 80 | Resp 12 | Ht 60.0 in | Wt 119.0 lb

## 2016-07-24 DIAGNOSIS — C50312 Malignant neoplasm of lower-inner quadrant of left female breast: Secondary | ICD-10-CM

## 2016-07-24 NOTE — Progress Notes (Signed)
[place]  [phone]  [phone]     Lauren Mayo DOB: 1955/12/22  MR#: [id number]  FQM#:[id number]  Patient Care Team: [name], MD as PCP - General (Internal Medicine) [name], MD (Internal Medicine) [name], MD (General Surgery)  CHIEF COMPLAINT: Pathologic stage Ia ER/PR positive adenocarcinoma of the lower inner quadrant of the left breast.  INTERVAL HISTORY:  Patient returns to clinic today for routine six-month follow-up. She continues to have persistent fatigue which she initially was thought related to letrozole. After discontinuing treatment for one month, her fatigue did not improve and she restarted letrozole. She continues to complain of weakness and fatigue. She has no neurologic complaints. She denies any recent fevers or illnesses. She has a good appetite and denies weight loss. She denies any chest pain, but does admit to occasional shortness of breath. She denies any nausea, vomiting, constipation, or diarrhea. She has no urinary complaints. Patient otherwise feels well and offers no further specific complaints.   REVIEW OF SYSTEMS:   Review of Systems  Constitutional: Positive for malaise/fatigue. Negative for fever and weight loss.  Respiratory: Positive for shortness of breath. Negative for cough.   Cardiovascular: Negative.  Negative for chest pain.  Gastrointestinal: Negative.  Negative for abdominal pain.  Genitourinary: Negative.   Neurological: Positive for weakness. Negative for sensory change.  Psychiatric/Behavioral: Negative.  The patient is not nervous/anxious.     As per HPI. Otherwise, a complete review of systems is negatve.  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 Arthritis    RIGHT LEG  . BRCA negative 06-22-15   Dr Bary Castilla  . BRCA negative 2016  . Cancer ([place])    melaoma   . Cancer of breast ([place]) 2016   INVASIVE LOBULAR CARCINOMA, T1b,N0. ER positive, PR negative, HER-2/neu not overexpressed. Lumpectomy c Radiation  . Family history of adverse reaction to anesthesia    SISTER-TROUBLE WAKING UP  . Hypercholesteremia   . Melanoma ([place])     PAST SURGICAL HISTORY: Past Surgical History:  Procedure Laterality Date  . ABDOMINAL HYSTERECTOMY  1990  . [place]  . BREAST BIOPSY Left    benign  . BREAST BIOPSY Left 06/16/2015   INVASIVE LOBULAR CARCINOMA  . BREAST LUMPECTOMY WITH SENTINEL LYMPH NODE BIOPSY Left 07/12/2015   Procedure: BREAST WIDE EXCISION WITH SENTINEL LYMPH NODE BX, MASTOPLASTY ;  Surgeon: [name], MD;  Location: [facility] ORS;  Service: General;  Laterality: Left;  . BREAST SURGERY Left 07/13/2015    Wide excision, mastoplasty, sentinel node biopsy.  [name] Bilateral   . COLONOSCOPY  2012   [facility]  . LEG SKIN LESION  BIOPSY / EXCISION Left 2012,2015   melanoma    FAMILY HISTORY Family History  Problem Relation Age of Onset  . Breast cancer Sister 41    2016  . Breast  cancer Paternal Aunt 32  . Hypertension Mother   . Diabetes Father   . Cancer Maternal Aunt 63    ovarian    GYNECOLOGIC HISTORY:  No LMP recorded. Patient has had a hysterectomy.     ADVANCED DIRECTIVES:    HEALTH MAINTENANCE: Social History  Substance Use Topics  . Smoking status: Never Smoker  . Smokeless tobacco: Never Used  . Alcohol use 0.0 oz/week     Comment: OCC     Colonoscopy:  Bone density:  Mammogram:July 2017  No Known Allergies  Current Outpatient  Prescriptions  Medication Sig Dispense Refill  . Calcium-Magnesium-Vitamin D (CALCIUM 500 PO) Take 2 tablets by mouth daily.    Marland Kitchen ibuprofen (ADVIL,MOTRIN) 200 MG tablet Take 200 mg by mouth every 6 (six) hours as needed.    . Vitamin D, Ergocalciferol, (DRISDOL) 50000 UNITS CAPS capsule Take 50,000 Units by mouth every 7 (seven) days.     . vitamin E 400 UNIT capsule Take 400 Units by mouth 2 (two) times daily.    Marland Kitchen letrozole (FEMARA) 2.5 MG tablet Take 1 tablet (2.5 mg total) by mouth daily. (Patient not taking: Reported on 07/24/2016) 90 tablet 1   No current facility-administered medications for this visit.     OBJECTIVE: BP 113/72 (BP Location: Right Arm, Patient Position: Sitting)   Pulse 66   Temp (!) 96.6 F (35.9 C) (Tympanic)   Resp 18   Wt 120 lb 7.7 oz (54.6 kg)   BMI 23.53 kg/m    Body mass index is 23.53 kg/m.    ECOG FS:0 - Asymptomatic  General: Well-developed, well-nourished, no acute distress. Eyes: Pink conjunctiva, anicteric sclera. Lungs: Clear to auscultation bilaterally. Breasts: Bilateral breasts and axilla without lumps or masses. Heart: Regular rate and rhythm. No rubs, murmurs, or gallops. Abdomen: Soft, nontender, nondistended. No organomegaly noted, normoactive bowel sounds. Musculoskeletal: No edema, cyanosis, or clubbing. Neuro: Alert, answering all questions appropriately. Cranial nerves grossly intact. Skin: No rashes or petechiae noted. Psych: Normal affect.   LAB RESULTS:  Appointment on 07/25/2016  Component Date Value Ref Range Status  . WBC 07/25/2016 5.2  3.6 - 11.0 K/uL Final  . RBC 07/25/2016 4.43  3.80 - 5.20 MIL/uL Final  . Hemoglobin 07/25/2016 12.8  12.0 - 16.0 g/dL Final  . HCT 07/25/2016 37.2  35.0 - 47.0 % Final  . MCV 07/25/2016 84.0  80.0 - 100.0 fL Final  . MCH 07/25/2016 28.8  26.0 - 34.0 pg Final  . MCHC 07/25/2016 34.3  32.0 - 36.0 g/dL Final  . RDW 07/25/2016 13.6  11.5 - 14.5 % Final  . Platelets 07/25/2016 260  150 -  440 K/uL Final  . Neutrophils Relative % 07/25/2016 62  % Final  . Neutro Abs 07/25/2016 3.2  1.4 - 6.5 K/uL Final  . Lymphocytes Relative 07/25/2016 29  % Final  . Lymphs Abs 07/25/2016 1.5  1.0 - 3.6 K/uL Final  . Monocytes Relative 07/25/2016 4  % Final  . Monocytes Absolute 07/25/2016 0.2  0.2 - 0.9 K/uL Final  . Eosinophils Relative 07/25/2016 4  % Final  . Eosinophils Absolute 07/25/2016 0.2  0 - 0.7 K/uL Final  . Basophils Relative 07/25/2016 1  % Final  . Basophils Absolute 07/25/2016 0.0  0 - 0.1 K/uL Final  . Sodium 07/25/2016 137  135 - 145 mmol/L Final  . Potassium 07/25/2016 3.9  3.5 - 5.1 mmol/L Final  . Chloride 07/25/2016 105  101 - 111 mmol/L Final  .  CO2 07/25/2016 28  22 - 32 mmol/L Final  . Glucose, Bld 07/25/2016 97  65 - 99 mg/dL Final  . BUN 07/25/2016 17  6 - 20 mg/dL Final  . Creatinine, Ser 07/25/2016 0.64  0.44 - 1.00 mg/dL Final  . Calcium 07/25/2016 8.6* 8.9 - 10.3 mg/dL Final  . Total Protein 07/25/2016 6.8  6.5 - 8.1 g/dL Final  . Albumin 07/25/2016 4.3  3.5 - 5.0 g/dL Final  . AST 07/25/2016 18  15 - 41 U/L Final  . ALT 07/25/2016 16  14 - 54 U/L Final  . Alkaline Phosphatase 07/25/2016 65  38 - 126 U/L Final  . Total Bilirubin 07/25/2016 0.6  0.3 - 1.2 mg/dL Final  . GFR calc non Af Amer 07/25/2016 >60  >60 mL/min Final  . GFR calc Af Amer 07/25/2016 >60  >60 mL/min Final   Comment: (NOTE) The eGFR has been calculated using the CKD EPI equation. This calculation has not been validated in all clinical situations. eGFR's persistently <60 mL/min signify possible Chronic Kidney Disease.   . Anion gap 07/25/2016 4* 5 - 15 Final    STUDIES: No results found.  ASSESSMENT: Pathologic stage Ia ER/PR positive adenocarcinoma of the lower inner quadrant of the left breast   PLAN:    1. Pathologic stage Ia ER/PR positive adenocarcinoma of the lower inner quadrant of the left breast: Patient was originally diagnosed in July 2016 and is status post  lumpectomy. She completed her XRT in November 2016. Her most recent mammogram on July 02, 2016 was reported as BI-RADS 1, repeat in one year. Continue letrozole completing 5 years of treatment in December 2021. Return to clinic in 6 months for routine evaluation. 2. Postmenopausal: Continue calcium and vitamin D. Patient will require a bone mineral density in the next 1-2 weeks.  Patient expressed understanding and was in agreement with this plan. She also understands that She can call clinic at any time with any questions, concerns, or complaints.   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

## 2016-07-24 NOTE — Progress Notes (Signed)
Patient ID: MARVALENE BARRETT, female   DOB: 04/12/1956, 60 y.o.   MRN: [id number]  Chief Complaint  Patient presents with  . Follow-up    mammogram    HPI GABRYEL TALAMO is a 60 y.o. female.  who presents for her follow up breast cancer and a breast evaluation. The most recent mammogram was done on 07-02-16.  Patient does perform regular self breast checks and gets regular mammograms done.    She did stop the Letrozole for 3 weeks now due to extreme fatigue per [place]. She has noticed that she cant' take a good deep breath. She has noticed this 4-5 weeks ago. She does not notice this every day. She does walk every day 40-45 minutes and she does not have trouble during exercise. She states she has had some indigestion/left chest discomfort that comes and goes. She states she noticed this a couple of weeks ago while walking and swinging her arms. First episode was 2 weeks ago and lasted a few hours, mid day. She states it got better when lying down. One only other episode was a few days ago and was less severe on the right chest. She thought she had "pulled a muscle" when it first happened. Her difficulty sleeping is getting back to normal off the letrozole. Appointment with Dr. Grayland Ormond is tomorrow.   HPI  Past Medical History:  Diagnosis Date  . Arthritis    RIGHT LEG  . BRCA negative 06-22-15   Dr Bary Castilla  . BRCA negative 2016  . Cancer ([place])    melaoma   . Cancer of breast ([place]) 2016   INVASIVE LOBULAR CARCINOMA, T1b,N0. ER positive, PR negative, HER-2/neu not overexpressed. Lumpectomy c Radiation  . Family history of adverse reaction to anesthesia    SISTER-TROUBLE WAKING UP  . Hypercholesteremia   . Melanoma [facility])     Past Surgical History:  Procedure Laterality Date  . ABDOMINAL HYSTERECTOMY  1990  . [place]  . BREAST BIOPSY Left    benign  . BREAST BIOPSY Left 06/16/2015   INVASIVE LOBULAR CARCINOMA  . BREAST LUMPECTOMY WITH  SENTINEL LYMPH NODE BIOPSY Left 07/12/2015   Procedure: BREAST WIDE EXCISION WITH SENTINEL LYMPH NODE BX, MASTOPLASTY ;  Surgeon: [name], MD;  Location: [facility] ORS;  Service: General;  Laterality: Left;  . BREAST SURGERY Left 07/13/2015    Wide excision, mastoplasty, sentinel node biopsy.  [name] Bilateral   . COLONOSCOPY  2012   [facility]  . LEG SKIN LESION  BIOPSY / EXCISION Left 2012,2015   melanoma    Family History  Problem Relation Age of Onset  . Breast cancer Sister 21    2016  . Breast cancer Paternal Aunt 2  . Hypertension Mother   . Diabetes Father   . Cancer Maternal Aunt 63    ovarian    Social History Social History  Substance Use Topics  . Smoking status: Never Smoker  . Smokeless tobacco: Never Used  . Alcohol use 0.0 oz/week     Comment: OCC    No Known Allergies  Current Outpatient Prescriptions  Medication Sig Dispense Refill  . Calcium-Magnesium-Vitamin D (CALCIUM 500 PO) Take 2 tablets by mouth daily.    Marland Kitchen ibuprofen (ADVIL,MOTRIN) 200 MG tablet Take 200 mg by mouth every 6 (six) hours as needed.    . Vitamin D, Ergocalciferol, (DRISDOL) 50000 UNITS CAPS capsule Take 50,000 Units by mouth every 7 (seven) days.     Marland Kitchen  vitamin E 400 UNIT capsule Take 400 Units by mouth 2 (two) times daily.    Marland Kitchen letrozole (FEMARA) 2.5 MG tablet Take 1 tablet (2.5 mg total) by mouth daily. (Patient not taking: Reported on 07/24/2016) 90 tablet 1   No current facility-administered medications for this visit.     Review of Systems Review of Systems  Constitutional: Negative.   Respiratory: Negative.   Cardiovascular: Negative.     Blood pressure 112/70, pulse 80, resp. rate 12, height 5' (1.524 m), weight 119 lb (54 kg), SpO2 99 %.  Physical Exam Physical Exam  Constitutional: She is oriented to person, place, and time. She appears well-developed and well-nourished.  HENT:  Mouth/Throat: Oropharynx is clear and moist.  Eyes: Conjunctivae are normal. No  scleral icterus.  Neck: Neck supple.  Cardiovascular: Normal rate, regular rhythm and normal heart sounds.   Pulmonary/Chest: Effort normal and breath sounds normal. Right breast exhibits no inverted nipple, no mass, no nipple discharge, no skin change and no tenderness. Left breast exhibits no inverted nipple, no mass, no nipple discharge, no skin change and no tenderness.    Lymphadenopathy:    She has no cervical adenopathy.    She has no axillary adenopathy.  Neurological: She is alert and oriented to person, place, and time.  Skin: Skin is warm and dry.  Psychiatric: Her behavior is normal.    Data Reviewed Bilateral mammograms dated 07/02/2016 showed a smoothly marginated mass in the right breast which ultrasound confirmed to be a simple cyst. BI-RADS-2.  Assessment    Benign breast exam.    Plan    Patient will continue her present antiestrogen therapy.    The patient has been asked to return to the office in one year with a bilateral diagnostic mammogram.   This information has been scribed by Karie Fetch RN, BSN,BC.  [name] 07/24/2016, 9:06 PM

## 2016-07-24 NOTE — Patient Instructions (Addendum)
The patient is aware to call back for any questions or concerns. The patient has been asked to return to the office in one year with a bilateral diagnostic mammogram. 

## 2016-07-25 ENCOUNTER — Inpatient Hospital Stay: Payer: 59

## 2016-07-25 ENCOUNTER — Inpatient Hospital Stay (HOSPITAL_BASED_OUTPATIENT_CLINIC_OR_DEPARTMENT_OTHER): Payer: 59 | Admitting: Oncology

## 2016-07-25 VITALS — BP 113/72 | HR 66 | Temp 96.6°F | Resp 18 | Wt 120.5 lb

## 2016-07-25 DIAGNOSIS — Z79811 Long term (current) use of aromatase inhibitors: Secondary | ICD-10-CM | POA: Diagnosis not present

## 2016-07-25 DIAGNOSIS — C50312 Malignant neoplasm of lower-inner quadrant of left female breast: Secondary | ICD-10-CM

## 2016-07-25 DIAGNOSIS — Z780 Asymptomatic menopausal state: Secondary | ICD-10-CM

## 2016-07-25 DIAGNOSIS — Z803 Family history of malignant neoplasm of breast: Secondary | ICD-10-CM

## 2016-07-25 DIAGNOSIS — R5381 Other malaise: Secondary | ICD-10-CM

## 2016-07-25 DIAGNOSIS — M1990 Unspecified osteoarthritis, unspecified site: Secondary | ICD-10-CM

## 2016-07-25 DIAGNOSIS — Z170 Estrogen receptor positive status [ER+]: Secondary | ICD-10-CM

## 2016-07-25 DIAGNOSIS — R5383 Other fatigue: Secondary | ICD-10-CM

## 2016-07-25 DIAGNOSIS — Z923 Personal history of irradiation: Secondary | ICD-10-CM

## 2016-07-25 DIAGNOSIS — Z79899 Other long term (current) drug therapy: Secondary | ICD-10-CM

## 2016-07-25 DIAGNOSIS — R0602 Shortness of breath: Secondary | ICD-10-CM

## 2016-07-25 DIAGNOSIS — R531 Weakness: Secondary | ICD-10-CM

## 2016-07-25 DIAGNOSIS — E7800 Pure hypercholesterolemia, unspecified: Secondary | ICD-10-CM

## 2016-07-25 DIAGNOSIS — Z85820 Personal history of malignant melanoma of skin: Secondary | ICD-10-CM

## 2016-07-25 LAB — CBC WITH DIFFERENTIAL/PLATELET
BASOS ABS: 0 10*3/uL (ref 0–0.1)
Basophils Relative: 1 %
Eosinophils Absolute: 0.2 10*3/uL (ref 0–0.7)
Eosinophils Relative: 4 %
HEMATOCRIT: 37.2 % (ref 35.0–47.0)
Hemoglobin: 12.8 g/dL (ref 12.0–16.0)
LYMPHS PCT: 29 %
Lymphs Abs: 1.5 10*3/uL (ref 1.0–3.6)
MCH: 28.8 pg (ref 26.0–34.0)
MCHC: 34.3 g/dL (ref 32.0–36.0)
MCV: 84 fL (ref 80.0–100.0)
MONO ABS: 0.2 10*3/uL (ref 0.2–0.9)
Monocytes Relative: 4 %
NEUTROS ABS: 3.2 10*3/uL (ref 1.4–6.5)
Neutrophils Relative %: 62 %
Platelets: 260 10*3/uL (ref 150–440)
RBC: 4.43 MIL/uL (ref 3.80–5.20)
RDW: 13.6 % (ref 11.5–14.5)
WBC: 5.2 10*3/uL (ref 3.6–11.0)

## 2016-07-25 LAB — COMPREHENSIVE METABOLIC PANEL
ALT: 16 U/L (ref 14–54)
AST: 18 U/L (ref 15–41)
Albumin: 4.3 g/dL (ref 3.5–5.0)
Alkaline Phosphatase: 65 U/L (ref 38–126)
Anion gap: 4 — ABNORMAL LOW (ref 5–15)
BILIRUBIN TOTAL: 0.6 mg/dL (ref 0.3–1.2)
BUN: 17 mg/dL (ref 6–20)
CALCIUM: 8.6 mg/dL — AB (ref 8.9–10.3)
CO2: 28 mmol/L (ref 22–32)
CREATININE: 0.64 mg/dL (ref 0.44–1.00)
Chloride: 105 mmol/L (ref 101–111)
GFR calc Af Amer: >60 mL/min (ref >60)
Glucose, Bld: 97 mg/dL (ref 65–99)
Potassium: 3.9 mmol/L (ref 3.5–5.1)
Sodium: 137 mmol/L (ref 135–145)
TOTAL PROTEIN: 6.8 g/dL (ref 6.5–8.1)

## 2016-07-25 NOTE — Progress Notes (Signed)
Pt complains of persistent fatigue. Was informed about 1 month ago to stop letrozole and come back to be seen. Former [facility] patient and is seeing Grayland Ormond for first time today. Pt states that despite stopping letrozole that she continues to have fatigue with intermittent shortness of breath.

## 2016-08-07 ENCOUNTER — Ambulatory Visit: Payer: 59

## 2016-08-20 ENCOUNTER — Ambulatory Visit: Payer: 59

## 2016-08-26 ENCOUNTER — Ambulatory Visit
Admission: RE | Admit: 2016-08-26 | Discharge: 2016-08-26 | Disposition: A | Discharge status: Home / Self Care | Payer: 59 | Source: Ambulatory Visit | Attending: Oncology | Admitting: Oncology

## 2016-08-26 DIAGNOSIS — Z13820 Encounter for screening for osteoporosis: Secondary | ICD-10-CM | POA: Insufficient documentation

## 2016-08-26 DIAGNOSIS — M810 Age-related osteoporosis without current pathological fracture: Secondary | ICD-10-CM | POA: Diagnosis not present

## 2016-08-26 DIAGNOSIS — Z853 Personal history of malignant neoplasm of breast: Secondary | ICD-10-CM | POA: Insufficient documentation

## 2016-08-26 DIAGNOSIS — C50312 Malignant neoplasm of lower-inner quadrant of left female breast: Secondary | ICD-10-CM

## 2016-09-13 ENCOUNTER — Encounter: Payer: Self-pay | Admitting: *Deleted

## 2016-09-26 ENCOUNTER — Other Ambulatory Visit: Payer: Self-pay | Admitting: *Deleted

## 2016-09-26 MED ORDER — LETROZOLE 2.5 MG PO TABS: 2.5000 mg | ORAL_TABLET | Freq: Every day | ORAL | 1 refills | Status: DC

## 2016-10-18 ENCOUNTER — Ambulatory Visit: Payer: 59 | Admitting: Radiation Oncology

## 2016-10-21 ENCOUNTER — Other Ambulatory Visit: Payer: 59

## 2016-10-21 ENCOUNTER — Ambulatory Visit: Payer: 59 | Admitting: Family Medicine

## 2016-10-21 ENCOUNTER — Ambulatory Visit: Payer: 59 | Admitting: Oncology

## 2016-10-22 ENCOUNTER — Ambulatory Visit
Admission: RE | Admit: 2016-10-22 | Discharge: 2016-10-22 | Disposition: A | Discharge status: Home / Self Care | Payer: 59 | Source: Ambulatory Visit | Attending: Radiation Oncology | Admitting: Radiation Oncology

## 2016-10-22 ENCOUNTER — Encounter: Payer: Self-pay | Admitting: Radiation Oncology

## 2016-10-22 VITALS — BP 100/60 | HR 74 | Temp 96.9°F | Resp 20 | Wt 120.6 lb

## 2016-10-22 DIAGNOSIS — C50212 Malignant neoplasm of upper-inner quadrant of left female breast: Secondary | ICD-10-CM

## 2016-10-22 DIAGNOSIS — Z170 Estrogen receptor positive status [ER+]: Secondary | ICD-10-CM | POA: Diagnosis not present

## 2016-10-22 DIAGNOSIS — Z79811 Long term (current) use of aromatase inhibitors: Secondary | ICD-10-CM | POA: Insufficient documentation

## 2016-10-22 DIAGNOSIS — C50912 Malignant neoplasm of unspecified site of left female breast: Secondary | ICD-10-CM | POA: Diagnosis not present

## 2016-10-22 NOTE — Progress Notes (Signed)
Radiation Oncology Follow up Note  Name: Lauren Mayo   Date:   10/22/2016 MRN:  [id number] DOB: [birth year]    This 60 y.o. female presents to the clinic today for 1 year follow-up status post wide local excision and whole breast radiation to her left breast for ER/positive lobular carcinoma.  REFERRING PROVIDER: [name], MD  HPI: Patient is a 60 year old female now 1 year out having completed whole breast radiation to her left breast for ER/PR positive PR negative invasive lobular carcinoma status post wide local excision and sentinel node biopsy. She is seen today in routine follow-up and is doing well. She specifically denies breast tenderness cough or bone pain.Marland Kitchen Her mammograms have been fine she is currently on Femara tolerating that well without side effect. Back in August she did have a simple cyst biopsy which was benign.  COMPLICATIONS OF TREATMENT: none  FOLLOW UP COMPLIANCE: keeps appointments   PHYSICAL EXAM:  BP 100/60   Pulse 74   Temp (!) 96.9 F (36.1 C)   Resp 20   Wt 120 lb 9.5 oz (54.7 kg)   BMI 23.55 kg/m  Lungs are clear to A&P cardiac examination essentially unremarkable with regular rate and rhythm. No dominant mass or nodularity is noted in either breast in 2 positions examined. Incision is well-healed. No axillary or supraclavicular adenopathy is appreciated. Cosmetic result is excellent. Well-developed well-nourished patient in NAD. HEENT reveals PERLA, EOMI, discs not visualized.  Oral cavity is clear. No oral mucosal lesions are identified. Neck is clear without evidence of cervical or supraclavicular adenopathy. Lungs are clear to A&P. Cardiac examination is essentially unremarkable with regular rate and rhythm without murmur rub or thrill. Abdomen is benign with no organomegaly or masses noted. Motor sensory and DTR levels are equal and symmetric in the upper and lower extremities. Cranial nerves II through XII are grossly intact. Proprioception  is intact. No peripheral adenopathy or edema is identified. No motor or sensory levels are noted. Crude visual fields are within normal range.  RADIOLOGY RESULTS: Most recent ultrasound and mammograms reviewed and compatible with the above-stated findings  PLAN: Present time she continues to do well with no evidence of disease 1 year out. I've asked to see her back in 1 year for follow-up. She continues on Femara without side effect. We had a discussion today about alcohol intake and I've assured her is okay to occasionally have social drink without fear of causing significant risk of increased chance of recurrent breast cancer. Patient is to call with any concerns.  I would like to take this opportunity to thank you for allowing me to participate in the care of your patient.Armstead Peaks., MD

## 2017-01-08 ENCOUNTER — Other Ambulatory Visit: Payer: Self-pay | Admitting: Internal Medicine

## 2017-01-08 DIAGNOSIS — M79604 Pain in right leg: Secondary | ICD-10-CM

## 2017-01-08 DIAGNOSIS — M5030 Other cervical disc degeneration, unspecified cervical region: Secondary | ICD-10-CM | POA: Insufficient documentation

## 2017-01-08 DIAGNOSIS — M5136 Other intervertebral disc degeneration, lumbar region: Secondary | ICD-10-CM | POA: Insufficient documentation

## 2017-01-14 ENCOUNTER — Ambulatory Visit: Payer: 59

## 2017-01-14 ENCOUNTER — Ambulatory Visit
Admission: RE | Admit: 2017-01-14 | Discharge: 2017-01-14 | Disposition: A | Discharge status: Home / Self Care | Payer: BLUE CROSS/BLUE SHIELD | Source: Ambulatory Visit | Attending: Internal Medicine | Admitting: Internal Medicine

## 2017-01-14 DIAGNOSIS — M79604 Pain in right leg: Secondary | ICD-10-CM

## 2017-01-27 ENCOUNTER — Ambulatory Visit: Payer: 59 | Admitting: Oncology

## 2017-02-02 NOTE — Progress Notes (Signed)
[place]  [phone]  [phone]) [phone]     Lauren Mayo DOB: 05/25/1956  MR#: [id number]  AOZ#:[id number]  Patient Care Team: [name], MD as PCP - General (Internal Medicine) [name], MD (Internal Medicine) [name], MD (General Surgery)  CHIEF COMPLAINT: Pathologic stage Ia ER/PR positive adenocarcinoma of the lower inner quadrant of the left breast.  INTERVAL HISTORY:  Patient returns to clinic today for routine six-month follow-up. She continues to tolerate letrozole well without significant side effects. Her weakness and fatigue have improved, but are still evident. She has a history of melanoma and recently saw dermatologist in February. She has no neurologic complaints. She denies any recent fevers or illnesses. She has a good appetite and denies weight loss. She denies any chest pain or shortness of breath. She denies any nausea, vomiting, constipation, or diarrhea. She has no urinary complaints. Patient offers no specific complaints today.   REVIEW OF SYSTEMS:   Review of Systems  Constitutional: Negative for fever, malaise/fatigue and weight loss.  Respiratory: Negative for cough and shortness of breath.   Cardiovascular: Negative.  Negative for chest pain.  Gastrointestinal: Negative.  Negative for abdominal pain.  Genitourinary: Negative.   Musculoskeletal: Negative.   Skin: Negative.   Neurological: Negative for sensory change and weakness.  Psychiatric/Behavioral: Negative.  The patient is not nervous/anxious.     As per HPI. Otherwise, a complete review of systems is negative.  ONCOLOGY HISTORY:   Breast cancer of lower-inner quadrant of left female breast ([place])   06/02/2015 Initial Diagnosis    Breast cancer of lower-inner quadrant of left female breast ([place]), T1 N0 M0, ER positive, PR negative, Her 2 negative      06/05/2015 Surgery    lumpectomy and SLN biopsy       - 10/20/2015 Radiation Therapy   XRT      10/20/2015 -  Anti-estrogen oral therapy    started on Letrozole       PAST MEDICAL HISTORY: Past Medical History:  Diagnosis Date  . Arthritis    RIGHT LEG  . BRCA negative 06-22-15   Dr Bary Castilla  . BRCA negative 2016  . Cancer ([place])    melaoma   . Cancer of breast ([place]) 2016   INVASIVE LOBULAR CARCINOMA, T1b,N0. ER positive, PR negative, HER-2/neu not overexpressed. Lumpectomy c Radiation  . Family history of adverse reaction to anesthesia    SISTER-TROUBLE WAKING UP  . Hypercholesteremia   . Melanoma ([place])     PAST SURGICAL HISTORY: Past Surgical History:  Procedure Laterality Date  . ABDOMINAL HYSTERECTOMY  1990  . [place]  . BREAST BIOPSY Left    benign  . BREAST BIOPSY Left 06/16/2015   INVASIVE LOBULAR CARCINOMA  . BREAST LUMPECTOMY WITH SENTINEL LYMPH NODE BIOPSY Left 07/12/2015   Procedure: BREAST WIDE EXCISION WITH SENTINEL LYMPH NODE BX, MASTOPLASTY ;  Surgeon: [name], MD;  Location: [facility] ORS;  Service: General;  Laterality: Left;  . BREAST SURGERY Left 07/13/2015    Wide excision, mastoplasty, sentinel node biopsy.  [name] Bilateral   . COLONOSCOPY  2012   [facility]  . LEG SKIN LESION  BIOPSY / EXCISION Left 2012,2015   melanoma    FAMILY HISTORY Family History  Problem Relation Age of Onset  . Breast cancer Sister 40    2016  . Breast cancer Paternal Aunt 73  . Hypertension Mother   . Diabetes  Father   . Cancer Maternal Aunt 63    ovarian    GYNECOLOGIC HISTORY:  No LMP recorded. Patient has had a hysterectomy.     ADVANCED DIRECTIVES:    HEALTH MAINTENANCE: Social History  Substance Use Topics  . Smoking status: Never Smoker  . Smokeless tobacco: Never Used  . Alcohol use 0.0 oz/week     Comment: OCC     Colonoscopy:  Bone density:  Mammogram:July 2017  No Known Allergies  Current Outpatient Prescriptions  Medication Sig Dispense Refill  . Calcium-Magnesium-Vitamin D  (CALCIUM 500 PO) Take 2 tablets by mouth daily.    Marland Kitchen ibuprofen (ADVIL,MOTRIN) 200 MG tablet Take 200 mg by mouth every 6 (six) hours as needed.    Marland Kitchen letrozole (FEMARA) 2.5 MG tablet Take 1 tablet (2.5 mg total) by mouth daily. 90 tablet 1  . Vitamin D, Ergocalciferol, (DRISDOL) 50000 UNITS CAPS capsule Take 50,000 Units by mouth every 7 (seven) days.     . vitamin E 400 UNIT capsule Take 400 Units by mouth 2 (two) times daily.     No current facility-administered medications for this visit.     OBJECTIVE: BP 99/62 (BP Location: Right Arm, Patient Position: Sitting) Comment: Patient states this is her baseline BP  Pulse 70   Temp 98.6 F (37 C) (Tympanic)   Resp 18   Wt 120 lb 5.9 oz (54.6 kg)   BMI 23.51 kg/m    Body mass index is 23.51 kg/m.    ECOG FS:0 - Asymptomatic  General: Well-developed, well-nourished, no acute distress. Eyes: Pink conjunctiva, anicteric sclera. Lungs: Clear to auscultation bilaterally. Breasts: Bilateral breasts and axilla without lumps or masses. Patient requested exam be deferred today. Heart: Regular rate and rhythm. No rubs, murmurs, or gallops. Abdomen: Soft, nontender, nondistended. No organomegaly noted, normoactive bowel sounds. Musculoskeletal: No edema, cyanosis, or clubbing. Neuro: Alert, answering all questions appropriately. Cranial nerves grossly intact. Skin: No rashes or petechiae noted. Psych: Normal affect.   LAB RESULTS:  No visits with results within 3 Day(s) from this visit.  Latest known visit with results is:  Appointment on 07/25/2016  Component Date Value Ref Range Status  . WBC 07/25/2016 5.2  3.6 - 11.0 K/uL Final  . RBC 07/25/2016 4.43  3.80 - 5.20 MIL/uL Final  . Hemoglobin 07/25/2016 12.8  12.0 - 16.0 g/dL Final  . HCT 07/25/2016 37.2  35.0 - 47.0 % Final  . MCV 07/25/2016 84.0  80.0 - 100.0 fL Final  . MCH 07/25/2016 28.8  26.0 - 34.0 pg Final  . MCHC 07/25/2016 34.3  32.0 - 36.0 g/dL Final  . RDW 07/25/2016 13.6   11.5 - 14.5 % Final  . Platelets 07/25/2016 260  150 - 440 K/uL Final  . Neutrophils Relative % 07/25/2016 62  % Final  . Neutro Abs 07/25/2016 3.2  1.4 - 6.5 K/uL Final  . Lymphocytes Relative 07/25/2016 29  % Final  . Lymphs Abs 07/25/2016 1.5  1.0 - 3.6 K/uL Final  . Monocytes Relative 07/25/2016 4  % Final  . Monocytes Absolute 07/25/2016 0.2  0.2 - 0.9 K/uL Final  . Eosinophils Relative 07/25/2016 4  % Final  . Eosinophils Absolute 07/25/2016 0.2  0 - 0.7 K/uL Final  . Basophils Relative 07/25/2016 1  % Final  . Basophils Absolute 07/25/2016 0.0  0 - 0.1 K/uL Final  . Sodium 07/25/2016 137  135 - 145 mmol/L Final  . Potassium 07/25/2016 3.9  3.5 - 5.1 mmol/L Final  .  Chloride 07/25/2016 105  101 - 111 mmol/L Final  . CO2 07/25/2016 28  22 - 32 mmol/L Final  . Glucose, Bld 07/25/2016 97  65 - 99 mg/dL Final  . BUN 07/25/2016 17  6 - 20 mg/dL Final  . Creatinine, Ser 07/25/2016 0.64  0.44 - 1.00 mg/dL Final  . Calcium 07/25/2016 8.6* 8.9 - 10.3 mg/dL Final  . Total Protein 07/25/2016 6.8  6.5 - 8.1 g/dL Final  . Albumin 07/25/2016 4.3  3.5 - 5.0 g/dL Final  . AST 07/25/2016 18  15 - 41 U/L Final  . ALT 07/25/2016 16  14 - 54 U/L Final  . Alkaline Phosphatase 07/25/2016 65  38 - 126 U/L Final  . Total Bilirubin 07/25/2016 0.6  0.3 - 1.2 mg/dL Final  . GFR calc non Af Amer 07/25/2016 >60  >60 mL/min Final  . GFR calc Af Amer 07/25/2016 >60  >60 mL/min Final   Comment: (NOTE) The eGFR has been calculated using the CKD EPI equation. This calculation has not been validated in all clinical situations. eGFR's persistently <60 mL/min signify possible Chronic Kidney Disease.   . Anion gap 07/25/2016 4* 5 - 15 Final    STUDIES: No results found.  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   PLAN:    1. Pathologic stage Ia ER/PR positive adenocarcinoma of the lower inner quadrant of the left breast: Patient was  originally diagnosed in July 2016 and is status post lumpectomy. She completed her XRT in November 2016. Her most recent mammogram on July 02, 2016 was reported as BI-RADS 1, repeat in August 2018. Continue letrozole completing 5 years of treatment in December 2021. Return to clinic in 6 months for routine evaluation. 2. Osteopenia: Patient's bone mineral density on August 26, 2016 revealed a T score of -1.8.  Continue calcium and vitamin D and repeat in September 2018.   Patient expressed understanding and was in agreement with this plan. She also understands that She can call clinic at any time with any questions, concerns, or complaints.   Breast cancer of lower-inner quadrant of left female breast [facility])   Staging form: Breast, AJCC 7th Edition     Clinical stage from 06/02/2015: Stage IA (T1, N0, M0) - Signed by Evlyn Kanner, NP on 04/18/2016   Lloyd Huger, MD   02/03/2017 3:31 PM

## 2017-02-03 ENCOUNTER — Inpatient Hospital Stay: Payer: BLUE CROSS/BLUE SHIELD | Attending: Oncology | Admitting: Oncology

## 2017-02-03 VITALS — BP 99/62 | HR 70 | Temp 98.6°F | Resp 18 | Wt 120.4 lb

## 2017-02-03 DIAGNOSIS — Z170 Estrogen receptor positive status [ER+]: Secondary | ICD-10-CM

## 2017-02-03 DIAGNOSIS — E7800 Pure hypercholesterolemia, unspecified: Secondary | ICD-10-CM

## 2017-02-03 DIAGNOSIS — Z79899 Other long term (current) drug therapy: Secondary | ICD-10-CM | POA: Diagnosis not present

## 2017-02-03 DIAGNOSIS — Z923 Personal history of irradiation: Secondary | ICD-10-CM | POA: Diagnosis not present

## 2017-02-03 DIAGNOSIS — Z8041 Family history of malignant neoplasm of ovary: Secondary | ICD-10-CM | POA: Insufficient documentation

## 2017-02-03 DIAGNOSIS — M8580 Other specified disorders of bone density and structure, unspecified site: Secondary | ICD-10-CM | POA: Diagnosis not present

## 2017-02-03 DIAGNOSIS — R531 Weakness: Secondary | ICD-10-CM | POA: Diagnosis not present

## 2017-02-03 DIAGNOSIS — R5383 Other fatigue: Secondary | ICD-10-CM

## 2017-02-03 DIAGNOSIS — C50312 Malignant neoplasm of lower-inner quadrant of left female breast: Secondary | ICD-10-CM | POA: Diagnosis not present

## 2017-02-03 DIAGNOSIS — Z85820 Personal history of malignant melanoma of skin: Secondary | ICD-10-CM | POA: Insufficient documentation

## 2017-02-03 DIAGNOSIS — Z803 Family history of malignant neoplasm of breast: Secondary | ICD-10-CM | POA: Diagnosis not present

## 2017-02-03 DIAGNOSIS — Z90710 Acquired absence of both cervix and uterus: Secondary | ICD-10-CM | POA: Diagnosis not present

## 2017-02-03 DIAGNOSIS — Z79811 Long term (current) use of aromatase inhibitors: Secondary | ICD-10-CM | POA: Diagnosis not present

## 2017-02-03 DIAGNOSIS — M1990 Unspecified osteoarthritis, unspecified site: Secondary | ICD-10-CM

## 2017-02-03 NOTE — Progress Notes (Signed)
Patient states she saw dermatologist in Feb.  Patient has had a history of melanoma.  States some of the places on her skin that have been darker are now turning white.  Dermatologist relayed to her this could be a sign of melanoma.  Would like to discuss this today.

## 2017-03-17 ENCOUNTER — Other Ambulatory Visit: Payer: Self-pay | Admitting: Oncology

## 2017-04-21 ENCOUNTER — Other Ambulatory Visit: Payer: Self-pay | Admitting: Internal Medicine

## 2017-04-21 DIAGNOSIS — R1031 Right lower quadrant pain: Secondary | ICD-10-CM

## 2017-05-19 ENCOUNTER — Ambulatory Visit
Admission: RE | Admit: 2017-05-19 | Discharge: 2017-05-19 | Disposition: A | Discharge status: Home / Self Care | Payer: BLUE CROSS/BLUE SHIELD | Source: Ambulatory Visit | Attending: Internal Medicine | Admitting: Internal Medicine

## 2017-05-19 DIAGNOSIS — R1031 Right lower quadrant pain: Secondary | ICD-10-CM | POA: Diagnosis not present

## 2017-05-19 MED ORDER — IOPAMIDOL (ISOVUE-300) INJECTION 61%
100.0000 mL | Freq: Once | INTRAVENOUS | Status: AC | PRN
  Administered 2017-05-19: 100 mL via INTRAVENOUS

## 2017-07-03 ENCOUNTER — Other Ambulatory Visit: Payer: Self-pay | Admitting: Oncology

## 2017-07-03 ENCOUNTER — Ambulatory Visit
Admission: RE | Admit: 2017-07-03 | Discharge: 2017-07-03 | Disposition: A | Discharge status: Home / Self Care | Payer: BLUE CROSS/BLUE SHIELD | Source: Ambulatory Visit | Attending: Oncology | Admitting: Oncology

## 2017-07-03 DIAGNOSIS — C50312 Malignant neoplasm of lower-inner quadrant of left female breast: Secondary | ICD-10-CM

## 2017-07-03 DIAGNOSIS — Z170 Estrogen receptor positive status [ER+]: Principal | ICD-10-CM

## 2017-07-03 DIAGNOSIS — Z853 Personal history of malignant neoplasm of breast: Secondary | ICD-10-CM | POA: Diagnosis not present

## 2017-07-03 DIAGNOSIS — Z9889 Other specified postprocedural states: Secondary | ICD-10-CM | POA: Insufficient documentation

## 2017-07-03 HISTORY — DX: Personal history of irradiation: Z92.3

## 2017-07-08 ENCOUNTER — Ambulatory Visit: Payer: 59 | Admitting: General Surgery

## 2017-07-27 NOTE — Progress Notes (Signed)
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  INTERVAL HISTORY:  Patient returns to clinic today for routine six-month follow-up. She continues to tolerate letrozole well without significant side effects. She feels her fatigue has improved and she has made an effort to start exercising daily. She feels this has improved her overall mood and she feels better. Her last bone density scan showed osteopenia and she started vitamin d and calcium which she continues to take. She does not regularly perform breast self-exams but denies noticing any abnormalities. Her last mammogram was 07/02/17 which did not show abnormalities. She has no neurologic complaints. She denies any recent fevers or illnesses. She has a good appetite and denies weight loss. She denies any chest pain or shortness of breath. She denies any nausea, vomiting, constipation, or diarrhea. She has no urinary complaints. Patient offers no specific complaints today.   REVIEW OF SYSTEMS:   Review of Systems  Constitutional: Negative for fever, malaise/fatigue and weight loss.  Respiratory: Negative for cough and shortness of breath.   Cardiovascular: Negative.  Negative for chest pain.  Gastrointestinal: Negative.  Negative for abdominal pain.  Genitourinary: Negative.   Musculoskeletal: Negative.   Skin: Negative.   Neurological: Negative for sensory change and weakness.  Psychiatric/Behavioral: Negative.  The patient is not nervous/anxious.     As per HPI. Otherwise, a complete review of systems is negative.  ONCOLOGY HISTORY:   Malignant neoplasm of left  female breast ([place])   06/02/2015 Initial Diagnosis    Breast cancer of lower-inner quadrant of left female breast ([place]), T1 N0 M0, ER positive, PR negative, Her 2 negative      06/05/2015 Surgery    lumpectomy and SLN biopsy       - 10/20/2015 Radiation Therapy    XRT      10/20/2015 -  Anti-estrogen oral therapy    started on Letrozole       PAST MEDICAL HISTORY: Past Medical History:  Diagnosis Date  . Arthritis    RIGHT LEG  . BRCA negative 06-22-15   Dr Bary Castilla  . BRCA negative 2016  . Cancer of breast ([place]) 2016   INVASIVE LOBULAR CARCINOMA, T1b,N0. ER positive, PR negative, HER-2/neu not overexpressed. Lumpectomy c Radiation  . Family history of adverse reaction to anesthesia    SISTER-TROUBLE WAKING UP  . Hypercholesteremia   . Melanoma ([place])   . Personal history of radiation therapy 10/20/2015   left breast ca    PAST SURGICAL HISTORY: Past Surgical History:  Procedure Laterality Date  . ABDOMINAL HYSTERECTOMY  1990  . [place]  . BREAST BIOPSY Left 06/16/2015   fibroadenoma and ADH 11:00 position  . BREAST BIOPSY Left 06/16/2015   INVASIVE LOBULAR CARCINOMA and invasive lobular insitu at 7:30 position  . BREAST LUMPECTOMY Left 07/12/2015   clear margins for removal of ADH and invasive lobular carcinoma. Lymph nodes neagive  . BREAST LUMPECTOMY WITH SENTINEL LYMPH NODE BIOPSY Left 07/12/2015   Procedure: BREAST WIDE EXCISION WITH SENTINEL LYMPH NODE BX, MASTOPLASTY ;  Surgeon: [name], MD;  Location: [facility] ORS;  Service: General;  Laterality: Left;  . BREAST SURGERY Left 07/13/2015    Wide excision, mastoplasty, sentinel node biopsy.  [name] Bilateral   . COLONOSCOPY  2012   [facility]  . LEG SKIN LESION  BIOPSY / EXCISION Left 2012,2015   melanoma    FAMILY HISTORY Family History  Problem Relation Age of Onset  . Breast cancer Sister 25       2016  . Breast cancer Paternal Aunt 75  . Hypertension Mother   .  Diabetes Father   . Cancer Maternal Aunt 63       ovarian    GYNECOLOGIC HISTORY:  No LMP recorded. Patient has had a hysterectomy.     ADVANCED DIRECTIVES:    HEALTH MAINTENANCE: Social History  Substance Use Topics  . Smoking status: Never Smoker  . Smokeless tobacco: Never Used  . Alcohol use 0.0 oz/week     Comment: OCC     Colonoscopy:  Bone density:  Mammogram:July 2017  No Known Allergies  Current Outpatient Prescriptions  Medication Sig Dispense Refill  . Calcium-Magnesium-Vitamin D (CALCIUM 500 PO) Take 2 tablets by mouth daily.    Marland Kitchen ibuprofen (ADVIL,MOTRIN) 200 MG tablet Take 200 mg by mouth every 6 (six) hours as needed.    Marland Kitchen letrozole (FEMARA) 2.5 MG tablet TAKE 1 TABLET (2.5 MG      TOTAL) DAILY. 90 tablet 1  . Vitamin D, Ergocalciferol, (DRISDOL) 50000 UNITS CAPS capsule Take 50,000 Units by mouth every 7 (seven) days.     . vitamin E 400 UNIT capsule Take 400 Units by mouth 2 (two) times daily.     No current facility-administered medications for this visit.     OBJECTIVE: BP 99/62 (BP Location: Right Arm, Patient Position: Sitting)   Pulse 74   Wt 118 lb 8 oz (53.8 kg)   BMI 23.14 kg/m    Body mass index is 23.14 kg/m.    ECOG FS:0 - Asymptomatic  General: Well-developed, well-nourished, no acute distress. Eyes: Pink conjunctiva, anicteric sclera. Lungs: Clear to auscultation bilaterally. Breasts: Patient requested exam be deferred today. Heart: Regular rate and rhythm. No rubs, murmurs, or gallops. Abdomen: Soft, nontender, nondistended. No organomegaly noted, normoactive bowel sounds. Musculoskeletal: No edema, cyanosis, or clubbing. Neuro: Alert, answering all questions appropriately. Cranial nerves grossly intact. Skin: No rashes or petechiae noted. Psych: Normal affect.   LAB RESULTS:  No visits with results within 3 Day(s) from this visit.  Latest known visit with results is:  Appointment on 07/25/2016  Component Date Value Ref Range  Status  . WBC 07/25/2016 5.2  3.6 - 11.0 K/uL Final  . RBC 07/25/2016 4.43  3.80 - 5.20 MIL/uL Final  . Hemoglobin 07/25/2016 12.8  12.0 - 16.0 g/dL Final  . HCT 07/25/2016 37.2  35.0 - 47.0 % Final  . MCV 07/25/2016 84.0  80.0 - 100.0 fL Final  . MCH 07/25/2016 28.8  26.0 - 34.0 pg Final  . MCHC 07/25/2016 34.3  32.0 - 36.0 g/dL Final  . RDW 07/25/2016 13.6  11.5 - 14.5 % Final  . Platelets 07/25/2016 260  150 - 440 K/uL Final  . Neutrophils Relative % 07/25/2016 62  % Final  . Neutro Abs 07/25/2016 3.2  1.4 - 6.5 K/uL Final  . Lymphocytes Relative 07/25/2016 29  % Final  . Lymphs Abs 07/25/2016 1.5  1.0 - 3.6 K/uL Final  . Monocytes Relative 07/25/2016 4  % Final  . Monocytes Absolute 07/25/2016 0.2  0.2 - 0.9 K/uL Final  .  Eosinophils Relative 07/25/2016 4  % Final  . Eosinophils Absolute 07/25/2016 0.2  0 - 0.7 K/uL Final  . Basophils Relative 07/25/2016 1  % Final  . Basophils Absolute 07/25/2016 0.0  0 - 0.1 K/uL Final  . Sodium 07/25/2016 137  135 - 145 mmol/L Final  . Potassium 07/25/2016 3.9  3.5 - 5.1 mmol/L Final  . Chloride 07/25/2016 105  101 - 111 mmol/L Final  . CO2 07/25/2016 28  22 - 32 mmol/L Final  . Glucose, Bld 07/25/2016 97  65 - 99 mg/dL Final  . BUN 07/25/2016 17  6 - 20 mg/dL Final  . Creatinine, Ser 07/25/2016 0.64  0.44 - 1.00 mg/dL Final  . Calcium 07/25/2016 8.6* 8.9 - 10.3 mg/dL Final  . Total Protein 07/25/2016 6.8  6.5 - 8.1 g/dL Final  . Albumin 07/25/2016 4.3  3.5 - 5.0 g/dL Final  . AST 07/25/2016 18  15 - 41 U/L Final  . ALT 07/25/2016 16  14 - 54 U/L Final  . Alkaline Phosphatase 07/25/2016 65  38 - 126 U/L Final  . Total Bilirubin 07/25/2016 0.6  0.3 - 1.2 mg/dL Final  . GFR calc non Af Amer 07/25/2016 >60  >60 mL/min Final  . GFR calc Af Amer 07/25/2016 >60  >60 mL/min Final   Comment: (NOTE) The eGFR has been calculated using the CKD EPI equation. This calculation has not been validated in all clinical situations. eGFR's persistently <60  mL/min signify possible Chronic Kidney Disease.   . Anion gap 07/25/2016 4* 5 - 15 Final    STUDIES: No results found.  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   PLAN:    1. 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 She completed her XRT in November 2016. Her most recent mammogram on July 02, 2017 was reported as BI-RADS 1, repeat in August 2019. Continue letrozole completing 5 years of treatment in December 2021. Return to clinic in 6 months for routine evaluation. Will plan to do breast exam at her next visit. 2. Osteopenia: Patient's bone mineral density on August 26, 2016 revealed a T score of -1.8.  Continue calcium 1245m and vitamin D and repeat in September 2018. Patient says she is out of town through September and requests imaging in October 2018 which is fine.  Patient expressed understanding and was in agreement with this plan. She also understands that She can call clinic at any time with any questions, concerns, or complaints.   Breast cancer of lower-inner quadrant of left female breast ([facility]   Staging form: Breast, AJCC 7th Edition     Clinical stage from 06/02/2015: Stage IA (T1, N0, M0) - Signed by LEvlyn Kanner NP on 04/18/2016  LBeverely Risen AZenia Resides NP 07/28/17 3:26 PM  Patient was seen and evaluated independently and I agree with the assessment and plan as dictated above.  TLloyd Huger MD 07/29/17 8:43 AM

## 2017-07-28 ENCOUNTER — Encounter: Payer: Self-pay | Admitting: Oncology

## 2017-07-28 ENCOUNTER — Inpatient Hospital Stay: Payer: BLUE CROSS/BLUE SHIELD | Attending: Oncology | Admitting: Oncology

## 2017-07-28 VITALS — BP 99/62 | HR 74 | Wt 118.5 lb

## 2017-07-28 DIAGNOSIS — Z923 Personal history of irradiation: Secondary | ICD-10-CM

## 2017-07-28 DIAGNOSIS — Z79811 Long term (current) use of aromatase inhibitors: Secondary | ICD-10-CM | POA: Diagnosis not present

## 2017-07-28 DIAGNOSIS — Z79899 Other long term (current) drug therapy: Secondary | ICD-10-CM

## 2017-07-28 DIAGNOSIS — Z85820 Personal history of malignant melanoma of skin: Secondary | ICD-10-CM | POA: Insufficient documentation

## 2017-07-28 DIAGNOSIS — Z170 Estrogen receptor positive status [ER+]: Secondary | ICD-10-CM | POA: Diagnosis not present

## 2017-07-28 DIAGNOSIS — Z8041 Family history of malignant neoplasm of ovary: Secondary | ICD-10-CM | POA: Diagnosis not present

## 2017-07-28 DIAGNOSIS — M8580 Other specified disorders of bone density and structure, unspecified site: Secondary | ICD-10-CM | POA: Diagnosis not present

## 2017-07-28 DIAGNOSIS — C50312 Malignant neoplasm of lower-inner quadrant of left female breast: Secondary | ICD-10-CM | POA: Diagnosis not present

## 2017-07-28 DIAGNOSIS — E7800 Pure hypercholesterolemia, unspecified: Secondary | ICD-10-CM | POA: Diagnosis not present

## 2017-07-28 DIAGNOSIS — R5383 Other fatigue: Secondary | ICD-10-CM | POA: Insufficient documentation

## 2017-07-28 DIAGNOSIS — Z803 Family history of malignant neoplasm of breast: Secondary | ICD-10-CM | POA: Insufficient documentation

## 2017-07-31 ENCOUNTER — Ambulatory Visit: Payer: BLUE CROSS/BLUE SHIELD | Admitting: Oncology

## 2017-08-07 ENCOUNTER — Ambulatory Visit: Payer: BLUE CROSS/BLUE SHIELD | Admitting: Oncology

## 2017-09-09 ENCOUNTER — Ambulatory Visit
Admission: RE | Admit: 2017-09-09 | Discharge: 2017-09-09 | Disposition: A | Discharge status: Home / Self Care | Payer: BLUE CROSS/BLUE SHIELD | Source: Ambulatory Visit | Attending: Nurse Practitioner | Admitting: Nurse Practitioner

## 2017-09-09 DIAGNOSIS — M85851 Other specified disorders of bone density and structure, right thigh: Secondary | ICD-10-CM | POA: Insufficient documentation

## 2017-09-09 DIAGNOSIS — Z170 Estrogen receptor positive status [ER+]: Secondary | ICD-10-CM | POA: Diagnosis present

## 2017-09-09 DIAGNOSIS — C50312 Malignant neoplasm of lower-inner quadrant of left female breast: Secondary | ICD-10-CM | POA: Diagnosis not present

## 2017-09-12 ENCOUNTER — Other Ambulatory Visit: Payer: Self-pay | Admitting: Oncology

## 2017-10-29 ENCOUNTER — Encounter: Payer: Self-pay | Admitting: Radiation Oncology

## 2017-10-29 ENCOUNTER — Other Ambulatory Visit: Payer: Self-pay

## 2017-10-29 ENCOUNTER — Ambulatory Visit
Admission: RE | Admit: 2017-10-29 | Discharge: 2017-10-29 | Disposition: A | Discharge status: Home / Self Care | Payer: BLUE CROSS/BLUE SHIELD | Source: Ambulatory Visit | Attending: Radiation Oncology | Admitting: Radiation Oncology

## 2017-10-29 VITALS — BP 110/65 | HR 77 | Temp 97.8°F | Wt 116.3 lb

## 2017-10-29 DIAGNOSIS — Z923 Personal history of irradiation: Secondary | ICD-10-CM | POA: Diagnosis not present

## 2017-10-29 DIAGNOSIS — C50212 Malignant neoplasm of upper-inner quadrant of left female breast: Secondary | ICD-10-CM | POA: Insufficient documentation

## 2017-10-29 DIAGNOSIS — Z170 Estrogen receptor positive status [ER+]: Secondary | ICD-10-CM | POA: Diagnosis not present

## 2017-10-29 DIAGNOSIS — Z79811 Long term (current) use of aromatase inhibitors: Secondary | ICD-10-CM | POA: Insufficient documentation

## 2017-10-29 NOTE — Progress Notes (Signed)
Radiation Oncology Follow up Note  Name: Lauren Mayo   Date:   10/29/2017 MRN:  [id number] DOB: December 31, 1955    This 61 y.o. female presents to the clinic today for a 2 year follow-up status post whole breast radiation to her left breast for ER positive lobular carcinoma status post wide local excision.Marland Kitchen  REFERRING PROVIDER: [name], MD  HPI: Patient is a 61 year old female now seen out 2 years having completed whole breast radiation to her left breast for ER/PR positive lobular carcinoma. Tumor was PR negative. She is seen today in routine follow-up and is doing well. She specifically denies breast tenderness cough or bone pain. Currently on Femara tolerating that well without side effect.. Her last mammograms were in August BI-RADS 2 benign which I have reviewed.  COMPLICATIONS OF TREATMENT: none  FOLLOW UP COMPLIANCE: keeps appointments   PHYSICAL EXAM:  BP 110/65   Pulse 77   Temp 97.8 F (36.6 C)   Wt 116 lb 4.7 oz (52.7 kg)   BMI 22.71 kg/m  Lungs are clear to A&P cardiac examination essentially unremarkable with regular rate and rhythm. No dominant mass or nodularity is noted in either breast in 2 positions examined. Incision is well-healed. No axillary or supraclavicular adenopathy is appreciated. Cosmetic result is excellent. Well-developed well-nourished patient in NAD. HEENT reveals PERLA, EOMI, discs not visualized.  Oral cavity is clear. No oral mucosal lesions are identified. Neck is clear without evidence of cervical or supraclavicular adenopathy. Lungs are clear to A&P. Cardiac examination is essentially unremarkable with regular rate and rhythm without murmur rub or thrill. Abdomen is benign with no organomegaly or masses noted. Motor sensory and DTR levels are equal and symmetric in the upper and lower extremities. Cranial nerves II through XII are grossly intact. Proprioception is intact. No peripheral adenopathy or edema is identified. No motor or sensory  levels are noted. Crude visual fields are within normal range.  RADIOLOGY RESULTS: Mammograms are reviewed and compatible with the above-stated findings  PLAN: Present time patient is doing well with no evidence of disease. She continues on Femara without side effect. I have asked to see her back in 1 year for follow-up. She is a rescheduled for follow-up mammograms next August. Patient knows to call with any concerns.  I would like to take this opportunity to thank you for allowing me to participate in the care of your patient.Armstead Peaks., MD

## 2018-01-25 NOTE — Progress Notes (Signed)
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  INTERVAL HISTORY:  Patient returns to clinic today for routine six-month follow-up. She continues to tolerate letrozole well without significant side effects.  She does not complain of weakness or fatigue today.  She has no neurologic complaints. She denies any recent fevers or illnesses. She has a good appetite and denies weight loss. She denies any chest pain or shortness of breath. She denies any nausea, vomiting, constipation, or diarrhea. She has no urinary complaints. Patient offers no specific complaints today.   REVIEW OF SYSTEMS:   Review of Systems  Constitutional: Negative for fever, malaise/fatigue and weight loss.  Respiratory: Negative for cough and shortness of breath.   Cardiovascular: Negative.  Negative for chest pain.  Gastrointestinal: Negative.  Negative for abdominal pain.  Genitourinary: Negative.  Negative for dysuria.  Musculoskeletal: Negative.   Skin: Negative.  Negative for rash.  Neurological: Negative.  Negative for sensory change and weakness.  Psychiatric/Behavioral: Negative.  The patient is not nervous/anxious.     As per HPI. Otherwise, a complete review of systems is negative.  ONCOLOGY HISTORY:   Malignant neoplasm of left female breast ([place])   06/02/2015 Initial Diagnosis    Breast cancer of lower-inner quadrant of left female breast ([place]), T1 N0 M0, ER positive, PR negative, Her 2 negative      06/05/2015 Surgery    lumpectomy and SLN biopsy       - 10/20/2015 Radiation Therapy    XRT      10/20/2015 -  Anti-estrogen oral  therapy    started on Letrozole       PAST MEDICAL HISTORY: Past Medical History:  Diagnosis Date  . Arthritis    RIGHT LEG  . BRCA negative 06-22-15   Dr Bary Castilla  . BRCA negative 2016  . Cancer of breast ([place]) 2016   INVASIVE LOBULAR CARCINOMA, T1b,N0. ER positive, PR negative, HER-2/neu not overexpressed. Lumpectomy c Radiation  . Family history of adverse reaction to anesthesia    SISTER-TROUBLE WAKING UP  . Hypercholesteremia   . Melanoma ([place])   . Personal history of radiation therapy 10/20/2015   left breast ca    PAST SURGICAL HISTORY: Past Surgical History:  Procedure Laterality Date  . ABDOMINAL HYSTERECTOMY  1990  . [place]  . BREAST BIOPSY Left 06/16/2015   fibroadenoma and ADH 11:00 position  . BREAST BIOPSY Left 06/16/2015   INVASIVE LOBULAR CARCINOMA and invasive lobular insitu at 7:30 position  . BREAST LUMPECTOMY Left 07/12/2015   clear margins for removal of ADH and invasive lobular carcinoma. Lymph nodes neagive  . BREAST LUMPECTOMY WITH SENTINEL LYMPH NODE BIOPSY Left 07/12/2015   Procedure: BREAST WIDE EXCISION WITH SENTINEL LYMPH NODE BX, MASTOPLASTY ;  Surgeon: [name], MD;  Location: [facility] ORS;  Service: General;  Laterality: Left;  . BREAST SURGERY Left 07/13/2015    Wide excision, mastoplasty, sentinel node biopsy.  [name] Bilateral   . COLONOSCOPY  2012   [facility]  . LEG SKIN LESION  BIOPSY / EXCISION Left 2012,2015   melanoma    FAMILY HISTORY Family  History  Problem Relation Age of Onset  . Breast cancer Sister 55       2016  . Breast cancer Paternal Aunt 24  . Hypertension Mother   . Diabetes Father   . Cancer Maternal Aunt 63       ovarian    GYNECOLOGIC HISTORY:  No LMP recorded. Patient has had a hysterectomy.     ADVANCED DIRECTIVES:    HEALTH MAINTENANCE: Social History   Tobacco Use  . Smoking status: Never Smoker  . Smokeless tobacco: Never Used  Substance Use Topics    . Alcohol use: Yes    Alcohol/week: 0.0 oz    Comment: OCC  . Drug use: No     Colonoscopy:  Bone density:  Mammogram:July 2017  No Known Allergies  Current Outpatient Medications  Medication Sig Dispense Refill  . Calcium-Magnesium-Vitamin D (CALCIUM 500 PO) Take 2 tablets by mouth daily.    Marland Kitchen letrozole (FEMARA) 2.5 MG tablet TAKE 1 TABLET (2.5 MG      TOTAL) DAILY. 90 tablet 1  . Vitamin D, Ergocalciferol, (DRISDOL) 50000 UNITS CAPS capsule Take 50,000 Units by mouth every 7 (seven) days.     . vitamin E 400 UNIT capsule Take 400 Units by mouth 2 (two) times daily.    Marland Kitchen albuterol (PROVENTIL HFA) 108 (90 Base) MCG/ACT inhaler Inhale into the lungs.    Marland Kitchen ibuprofen (ADVIL,MOTRIN) 200 MG tablet Take 200 mg by mouth every 6 (six) hours as needed.     No current facility-administered medications for this visit.     OBJECTIVE: BP 96/62 (BP Location: Left Arm, Patient Position: Sitting)   Pulse 67   Temp (!) 96.6 F (35.9 C) (Tympanic)   Resp 18   Wt 119 lb (54 kg)   BMI 23.24 kg/m    Body mass index is 23.24 kg/m.    ECOG FS:0 - Asymptomatic  General: Well-developed, well-nourished, no acute distress. Eyes: Pink conjunctiva, anicteric sclera. Lungs: Clear to auscultation bilaterally. Breasts: Bilateral breasts and axilla without lumps or masses. Patient requested exam be deferred today. Heart: Regular rate and rhythm. No rubs, murmurs, or gallops. Abdomen: Soft, nontender, nondistended. No organomegaly noted, normoactive bowel sounds. Musculoskeletal: No edema, cyanosis, or clubbing. Neuro: Alert, answering all questions appropriately. Cranial nerves grossly intact. Skin: No rashes or petechiae noted. Psych: Normal affect.   LAB RESULTS:  No visits with results within 3 Day(s) from this visit.  Latest known visit with results is:  Appointment on 07/25/2016  Component Date Value Ref Range Status  . WBC 07/25/2016 5.2  3.6 - 11.0 K/uL Final  . RBC 07/25/2016 4.43   3.80 - 5.20 MIL/uL Final  . Hemoglobin 07/25/2016 12.8  12.0 - 16.0 g/dL Final  . HCT 07/25/2016 37.2  35.0 - 47.0 % Final  . MCV 07/25/2016 84.0  80.0 - 100.0 fL Final  . MCH 07/25/2016 28.8  26.0 - 34.0 pg Final  . MCHC 07/25/2016 34.3  32.0 - 36.0 g/dL Final  . RDW 07/25/2016 13.6  11.5 - 14.5 % Final  . Platelets 07/25/2016 260  150 - 440 K/uL Final  . Neutrophils Relative % 07/25/2016 62  % Final  . Neutro Abs 07/25/2016 3.2  1.4 - 6.5 K/uL Final  . Lymphocytes Relative 07/25/2016 29  % Final  . Lymphs Abs 07/25/2016 1.5  1.0 - 3.6 K/uL Final  . Monocytes Relative 07/25/2016 4  % Final  . Monocytes Absolute 07/25/2016 0.2  0.2 - 0.9 K/uL Final  .  Eosinophils Relative 07/25/2016 4  % Final  . Eosinophils Absolute 07/25/2016 0.2  0 - 0.7 K/uL Final  . Basophils Relative 07/25/2016 1  % Final  . Basophils Absolute 07/25/2016 0.0  0 - 0.1 K/uL Final  . Sodium 07/25/2016 137  135 - 145 mmol/L Final  . Potassium 07/25/2016 3.9  3.5 - 5.1 mmol/L Final  . Chloride 07/25/2016 105  101 - 111 mmol/L Final  . CO2 07/25/2016 28  22 - 32 mmol/L Final  . Glucose, Bld 07/25/2016 97  65 - 99 mg/dL Final  . BUN 07/25/2016 17  6 - 20 mg/dL Final  . Creatinine, Ser 07/25/2016 0.64  0.44 - 1.00 mg/dL Final  . Calcium 07/25/2016 8.6* 8.9 - 10.3 mg/dL Final  . Total Protein 07/25/2016 6.8  6.5 - 8.1 g/dL Final  . Albumin 07/25/2016 4.3  3.5 - 5.0 g/dL Final  . AST 07/25/2016 18  15 - 41 U/L Final  . ALT 07/25/2016 16  14 - 54 U/L Final  . Alkaline Phosphatase 07/25/2016 65  38 - 126 U/L Final  . Total Bilirubin 07/25/2016 0.6  0.3 - 1.2 mg/dL Final  . GFR calc non Af Amer 07/25/2016 >60  >60 mL/min Final  . GFR calc Af Amer 07/25/2016 >60  >60 mL/min Final   Comment: (NOTE) The eGFR has been calculated using the CKD EPI equation. This calculation has not been validated in all clinical situations. eGFR's persistently <60 mL/min signify possible Chronic Kidney Disease.   . Anion gap 07/25/2016 4*  5 - 15 Final    STUDIES: No results found.  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   PLAN:    1. 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 She completed her XRT in November 2016. Her most recent mammogram on July 03, 2017 was reported as BI-RADS 2, repeat in August 2019. Continue letrozole completing 5 years of treatment in December 2021. Return to clinic in 6 months for routine evaluation. 2. Osteopenia: Patient's bone mineral density on September 09, 2017 revealed a T score of -1.8 which is unchanged from one year prior.  Continue calcium and vitamin D and repeat in September 2019.  Approximately 20 minutes was spent in discussion of which greater than 50% was consultation.  Patient expressed understanding and was in agreement with this plan. She also understands that She can call clinic at any time with any questions, concerns, or complaints.   Breast cancer of lower-inner quadrant of left female breast [facility])   Staging form: Breast, AJCC 7th Edition     Clinical stage from 06/02/2015: Stage IA (T1, N0, M0) - Signed by Evlyn Kanner, NP on 04/18/2016   Lloyd Huger, MD   01/28/2018 4:35 PM

## 2018-01-26 ENCOUNTER — Inpatient Hospital Stay: Payer: 59 | Attending: Oncology | Admitting: Oncology

## 2018-01-26 ENCOUNTER — Other Ambulatory Visit: Payer: Self-pay | Admitting: *Deleted

## 2018-01-26 ENCOUNTER — Other Ambulatory Visit: Payer: Self-pay

## 2018-01-26 VITALS — BP 96/62 | HR 67 | Temp 96.6°F | Resp 18 | Wt 119.0 lb

## 2018-01-26 DIAGNOSIS — Z170 Estrogen receptor positive status [ER+]: Secondary | ICD-10-CM

## 2018-01-26 DIAGNOSIS — Z85820 Personal history of malignant melanoma of skin: Secondary | ICD-10-CM | POA: Diagnosis not present

## 2018-01-26 DIAGNOSIS — Z79811 Long term (current) use of aromatase inhibitors: Secondary | ICD-10-CM

## 2018-01-26 DIAGNOSIS — C50312 Malignant neoplasm of lower-inner quadrant of left female breast: Secondary | ICD-10-CM

## 2018-01-26 DIAGNOSIS — M8580 Other specified disorders of bone density and structure, unspecified site: Secondary | ICD-10-CM | POA: Diagnosis not present

## 2018-01-26 DIAGNOSIS — C50012 Malignant neoplasm of nipple and areola, left female breast: Secondary | ICD-10-CM

## 2018-01-26 NOTE — Progress Notes (Signed)
Here for follow up

## 2018-03-08 ENCOUNTER — Other Ambulatory Visit: Payer: Self-pay | Admitting: Oncology

## 2018-06-26 ENCOUNTER — Other Ambulatory Visit: Payer: Self-pay | Admitting: Internal Medicine

## 2018-06-26 DIAGNOSIS — M5442 Lumbago with sciatica, left side: Principal | ICD-10-CM

## 2018-06-26 DIAGNOSIS — M5441 Lumbago with sciatica, right side: Secondary | ICD-10-CM

## 2018-07-06 ENCOUNTER — Ambulatory Visit
Admission: RE | Admit: 2018-07-06 | Discharge: 2018-07-06 | Disposition: A | Discharge status: Home / Self Care | Payer: 59 | Source: Ambulatory Visit | Attending: Oncology | Admitting: Oncology

## 2018-07-06 DIAGNOSIS — C50312 Malignant neoplasm of lower-inner quadrant of left female breast: Secondary | ICD-10-CM | POA: Insufficient documentation

## 2018-07-06 DIAGNOSIS — Z170 Estrogen receptor positive status [ER+]: Secondary | ICD-10-CM

## 2018-07-09 ENCOUNTER — Ambulatory Visit
Admission: RE | Admit: 2018-07-09 | Discharge: 2018-07-09 | Disposition: A | Discharge status: Home / Self Care | Payer: 59 | Source: Ambulatory Visit | Attending: Internal Medicine | Admitting: Internal Medicine

## 2018-07-09 DIAGNOSIS — M47816 Spondylosis without myelopathy or radiculopathy, lumbar region: Secondary | ICD-10-CM | POA: Diagnosis not present

## 2018-07-09 DIAGNOSIS — M5441 Lumbago with sciatica, right side: Secondary | ICD-10-CM | POA: Insufficient documentation

## 2018-07-09 DIAGNOSIS — M5442 Lumbago with sciatica, left side: Secondary | ICD-10-CM | POA: Diagnosis not present

## 2018-07-26 NOTE — Progress Notes (Signed)
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  INTERVAL HISTORY: Patient returns to clinic today for routine six-month follow-up.  She continues to tolerate letrozole well without significant side effects.  She has chronic back pain from degenerative disc disease, but otherwise feels well and is asymptomatic.  She has no neurologic complaints. She denies any recent fevers or illnesses. She has a good appetite and denies weight loss. She denies any chest pain or shortness of breath. She denies any nausea, vomiting, constipation, or diarrhea. She has no urinary complaints.  Patient offers no specific complaints today.  REVIEW OF SYSTEMS:   Review of Systems  Constitutional: Negative.  Negative for fever, malaise/fatigue and weight loss.  Respiratory: Negative.  Negative for cough and shortness of breath.   Cardiovascular: Negative.  Negative for chest pain and leg swelling.  Gastrointestinal: Negative.  Negative for abdominal pain.  Genitourinary: Negative.  Negative for dysuria.  Musculoskeletal: Positive for back pain.  Skin: Negative.  Negative for rash.  Neurological: Negative.  Negative for sensory change, weakness and headaches.  Psychiatric/Behavioral: Negative.  The patient is not nervous/anxious.     As per HPI. Otherwise, a complete review of systems is negative.  ONCOLOGY HISTORY:   Malignant neoplasm of left female breast (Lauren Mayo)   06/02/2015 Initial Diagnosis    Breast cancer of lower-inner quadrant of left female breast ([place]), T1 N0 M0, ER positive, PR negative, Her 2 negative    06/05/2015 Surgery   lumpectomy and SLN biopsy     - 10/20/2015 Radiation Therapy    XRT    10/20/2015 -  Anti-estrogen oral therapy    started on Letrozole     PAST MEDICAL HISTORY: Past Medical History:  Diagnosis Date  . Arthritis    RIGHT LEG  . BRCA negative 06-22-15   Dr Lauren Castilla  . BRCA negative 2016  . Cancer of breast ([place]) 2016   INVASIVE LOBULAR CARCINOMA, T1b,N0. ER positive, PR negative, HER-2/neu not overexpressed. Lumpectomy c Radiation  . Family history of adverse reaction to anesthesia    SISTER-TROUBLE WAKING UP  . Hypercholesteremia   . Melanoma ([place])   . Personal history of radiation therapy 10/20/2015   left breast ca    PAST SURGICAL HISTORY: Past Surgical History:  Procedure Laterality Date  . ABDOMINAL HYSTERECTOMY  1990  . [place]  . BREAST BIOPSY Left 06/16/2015   fibroadenoma and ADH 11:00 position  . BREAST BIOPSY Left 06/16/2015   INVASIVE LOBULAR CARCINOMA and invasive lobular insitu at 7:30 position  . BREAST LUMPECTOMY Left 07/12/2015   clear margins for removal of ADH and invasive lobular carcinoma. Lymph nodes neagive  . BREAST LUMPECTOMY WITH SENTINEL LYMPH NODE BIOPSY Left 07/12/2015   Procedure: BREAST WIDE EXCISION WITH SENTINEL LYMPH NODE BX, MASTOPLASTY ;  Surgeon: [name], MD;  Location: [facility] ORS;  Service: General;  Laterality: Left;  . BREAST SURGERY Left 07/13/2015    Wide excision, mastoplasty, sentinel node biopsy.  [name] Bilateral   . COLONOSCOPY  2012   [facility]  . LEG SKIN LESION  BIOPSY / EXCISION Left 2012,2015  melanoma    FAMILY HISTORY Family History  Problem Relation Age of Onset  . Breast cancer Sister 11       2016  . Breast cancer Paternal Aunt 53  . Hypertension Mother   . Diabetes Father   . Cancer Maternal Aunt 63       ovarian    GYNECOLOGIC HISTORY:  No LMP recorded. Patient has had a hysterectomy.     ADVANCED DIRECTIVES:    HEALTH MAINTENANCE: Social History    Tobacco Use  . Smoking status: Never Smoker  . Smokeless tobacco: Never Used  Substance Use Topics  . Alcohol use: Yes    Alcohol/week: 0.0 standard drinks    Comment: OCC  . Drug use: No     Colonoscopy:  Bone density:  Mammogram:July 2017  No Known Allergies  Current Outpatient Medications  Medication Sig Dispense Refill  . albuterol (PROVENTIL HFA) 108 (90 Base) MCG/ACT inhaler Inhale into the lungs.    . Calcium-Magnesium-Vitamin D (CALCIUM 500 PO) Take 2 tablets by mouth daily.    Marland Kitchen ibuprofen (ADVIL,MOTRIN) 200 MG tablet Take 200 mg by mouth every 6 (six) hours as needed.    Marland Kitchen letrozole (FEMARA) 2.5 MG tablet TAKE 1 TABLET (2.5 MG      TOTAL) DAILY. 90 tablet 1  . Vitamin D, Ergocalciferol, (DRISDOL) 50000 UNITS CAPS capsule Take 50,000 Units by mouth every 7 (seven) days.     . vitamin E 400 UNIT capsule Take 400 Units by mouth 2 (two) times daily.     No current facility-administered medications for this visit.     OBJECTIVE: BP 100/65 (BP Location: Right Arm, Patient Position: Sitting)   Pulse 88   Temp 98 F (36.7 C) (Tympanic)   Resp 12   Ht 5' (1.524 m)   Wt 117 lb 6.4 oz (53.3 kg)   BMI 22.93 kg/m    Body mass index is 22.93 kg/m.    ECOG FS:0 - Asymptomatic  General: Well-developed, well-nourished, no acute distress. Eyes: Pink conjunctiva, anicteric sclera. HEENT: Normocephalic, moist mucous membranes. Breast: Bilateral breast and axilla without lumps or masses. Lungs: Clear to auscultation bilaterally. Heart: Regular rate and rhythm. No rubs, murmurs, or gallops. Abdomen: Soft, nontender, nondistended. No organomegaly noted, normoactive bowel sounds. Musculoskeletal: No edema, cyanosis, or clubbing. Neuro: Alert, answering all questions appropriately. Cranial nerves grossly intact. Skin: No rashes or petechiae noted. Psych: Normal affect.  LAB RESULTS:  No visits with results within 3 Day(s) from this visit.  Latest known visit with results  is:  Appointment on 07/25/2016  Component Date Value Ref Range Status  . WBC 07/25/2016 5.2  3.6 - 11.0 K/uL Final  . RBC 07/25/2016 4.43  3.80 - 5.20 MIL/uL Final  . Hemoglobin 07/25/2016 12.8  12.0 - 16.0 g/dL Final  . HCT 07/25/2016 37.2  35.0 - 47.0 % Final  . MCV 07/25/2016 84.0  80.0 - 100.0 fL Final  . MCH 07/25/2016 28.8  26.0 - 34.0 pg Final  . MCHC 07/25/2016 34.3  32.0 - 36.0 g/dL Final  . RDW 07/25/2016 13.6  11.5 - 14.5 % Final  . Platelets 07/25/2016 260  150 - 440 K/uL Final  . Neutrophils Relative % 07/25/2016 62  % Final  . Neutro Abs 07/25/2016 3.2  1.4 - 6.5 K/uL Final  . Lymphocytes Relative 07/25/2016 29  % Final  . Lymphs Abs 07/25/2016 1.5  1.0 - 3.6 K/uL Final  . Monocytes Relative 07/25/2016 4  % Final  .  Monocytes Absolute 07/25/2016 0.2  0.2 - 0.9 K/uL Final  . Eosinophils Relative 07/25/2016 4  % Final  . Eosinophils Absolute 07/25/2016 0.2  0 - 0.7 K/uL Final  . Basophils Relative 07/25/2016 1  % Final  . Basophils Absolute 07/25/2016 0.0  0 - 0.1 K/uL Final  . Sodium 07/25/2016 137  135 - 145 mmol/L Final  . Potassium 07/25/2016 3.9  3.5 - 5.1 mmol/L Final  . Chloride 07/25/2016 105  101 - 111 mmol/L Final  . CO2 07/25/2016 28  22 - 32 mmol/L Final  . Glucose, Bld 07/25/2016 97  65 - 99 mg/dL Final  . BUN 07/25/2016 17  6 - 20 mg/dL Final  . Creatinine, Ser 07/25/2016 0.64  0.44 - 1.00 mg/dL Final  . Calcium 07/25/2016 8.6* 8.9 - 10.3 mg/dL Final  . Total Protein 07/25/2016 6.8  6.5 - 8.1 g/dL Final  . Albumin 07/25/2016 4.3  3.5 - 5.0 g/dL Final  . AST 07/25/2016 18  15 - 41 U/L Final  . ALT 07/25/2016 16  14 - 54 U/L Final  . Alkaline Phosphatase 07/25/2016 65  38 - 126 U/L Final  . Total Bilirubin 07/25/2016 0.6  0.3 - 1.2 mg/dL Final  . GFR calc non Af Amer 07/25/2016 >60  >60 mL/min Final  . GFR calc Af Amer 07/25/2016 >60  >60 mL/min Final   Comment: (NOTE) The eGFR has been calculated using the CKD EPI equation. This calculation has not  been validated in all clinical situations. eGFR's persistently <60 mL/min signify possible Chronic Kidney Disease.   . Anion gap 07/25/2016 4* 5 - 15 Final    STUDIES: No results found.  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   PLAN:    1. 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 She completed adjuvant XRT in November 2016.  Given the low stage of disease, she did not require chemotherapy.  Her most recent mammogram on July 06, 2018 was reported as BI-RADS 2.  Repeat in August 2020. Continue letrozole completing 5 years of treatment in December 2021.  Return to clinic in 6 months for routine evaluation.   2. Osteopenia: Patient's bone mineral density on September 09, 2017 revealed a T score of -1.8 which is unchanged from one year prior.  Continue calcium and vitamin D supplementation.  Repeat in October 2019.  I spent a total of 20 minutes face-to-face with the patient of which greater than 50% of the visit was spent in counseling and coordination of care as detailed above.   Patient expressed understanding and was in agreement with this plan. She also understands that She can call clinic at any time with any questions, concerns, or complaints.   Breast cancer of lower-inner quadrant of left female breast [facility])   Staging form: Breast, AJCC 7th Edition     Clinical stage from 06/02/2015: Stage IA (T1, N0, M0) - Signed by Evlyn Kanner, NP on 04/18/2016   Lloyd Huger, MD   08/01/2018 7:18 AM

## 2018-07-28 ENCOUNTER — Other Ambulatory Visit: Payer: Self-pay

## 2018-07-28 ENCOUNTER — Inpatient Hospital Stay: Payer: 59 | Attending: Oncology | Admitting: Oncology

## 2018-07-28 ENCOUNTER — Encounter: Payer: Self-pay | Admitting: Oncology

## 2018-07-28 VITALS — BP 100/65 | HR 88 | Temp 98.0°F | Resp 12 | Ht 60.0 in | Wt 117.4 lb

## 2018-07-28 DIAGNOSIS — M8588 Other specified disorders of bone density and structure, other site: Secondary | ICD-10-CM | POA: Diagnosis not present

## 2018-07-28 DIAGNOSIS — Z923 Personal history of irradiation: Secondary | ICD-10-CM

## 2018-07-28 DIAGNOSIS — G8929 Other chronic pain: Secondary | ICD-10-CM | POA: Insufficient documentation

## 2018-07-28 DIAGNOSIS — M549 Dorsalgia, unspecified: Secondary | ICD-10-CM | POA: Insufficient documentation

## 2018-07-28 DIAGNOSIS — C50012 Malignant neoplasm of nipple and areola, left female breast: Secondary | ICD-10-CM

## 2018-07-28 DIAGNOSIS — Z79811 Long term (current) use of aromatase inhibitors: Secondary | ICD-10-CM | POA: Diagnosis not present

## 2018-07-28 DIAGNOSIS — E559 Vitamin D deficiency, unspecified: Secondary | ICD-10-CM | POA: Diagnosis not present

## 2018-07-28 DIAGNOSIS — M8580 Other specified disorders of bone density and structure, unspecified site: Secondary | ICD-10-CM | POA: Diagnosis not present

## 2018-07-28 DIAGNOSIS — Z170 Estrogen receptor positive status [ER+]: Secondary | ICD-10-CM | POA: Diagnosis not present

## 2018-07-28 DIAGNOSIS — C50312 Malignant neoplasm of lower-inner quadrant of left female breast: Secondary | ICD-10-CM

## 2018-07-28 NOTE — Progress Notes (Signed)
Patient here for follow up. She has recently been diagnosed with degenerative disc disease. She does not remember when her last clinical breast exam was performed.

## 2018-09-07 ENCOUNTER — Other Ambulatory Visit: Payer: Self-pay | Admitting: Oncology

## 2018-09-10 ENCOUNTER — Ambulatory Visit
Admission: RE | Admit: 2018-09-10 | Discharge: 2018-09-10 | Disposition: A | Discharge status: Home / Self Care | Payer: 59 | Source: Ambulatory Visit | Attending: Oncology | Admitting: Oncology

## 2018-09-10 DIAGNOSIS — C50012 Malignant neoplasm of nipple and areola, left female breast: Secondary | ICD-10-CM

## 2018-09-10 DIAGNOSIS — E559 Vitamin D deficiency, unspecified: Secondary | ICD-10-CM

## 2018-11-04 ENCOUNTER — Encounter: Payer: Self-pay | Admitting: Radiation Oncology

## 2018-11-04 ENCOUNTER — Other Ambulatory Visit: Payer: Self-pay

## 2018-11-04 ENCOUNTER — Ambulatory Visit
Admission: RE | Admit: 2018-11-04 | Discharge: 2018-11-04 | Disposition: A | Discharge status: Home / Self Care | Payer: 59 | Source: Ambulatory Visit | Attending: Radiation Oncology | Admitting: Radiation Oncology

## 2018-11-04 VITALS — BP 117/72 | HR 88 | Temp 96.9°F | Resp 18 | Wt 114.7 lb

## 2018-11-04 DIAGNOSIS — Z923 Personal history of irradiation: Secondary | ICD-10-CM | POA: Insufficient documentation

## 2018-11-04 DIAGNOSIS — Z170 Estrogen receptor positive status [ER+]: Secondary | ICD-10-CM | POA: Insufficient documentation

## 2018-11-04 DIAGNOSIS — C50212 Malignant neoplasm of upper-inner quadrant of left female breast: Secondary | ICD-10-CM | POA: Insufficient documentation

## 2018-11-04 DIAGNOSIS — M549 Dorsalgia, unspecified: Secondary | ICD-10-CM | POA: Diagnosis not present

## 2018-11-04 DIAGNOSIS — Z79811 Long term (current) use of aromatase inhibitors: Secondary | ICD-10-CM | POA: Diagnosis not present

## 2018-11-04 NOTE — Progress Notes (Signed)
Radiation Oncology Follow up Note  Name: Lauren Mayo   Date:   11/04/2018 MRN:  [id number] DOB: Apr 21, 1956    This 62 y.o. female presents to the clinic today for 3 year follow-up status post whole breast radiation to her left breast for ER/PR positive lobular carcinoma status post wide local excision.  REFERRING PROVIDER: [name], MD  HPI: patient is a 62 year old female now out 3 years having completed whole breast radiation to her left breast for ER/PR positive lobular carcinoma in situ. Seen today in routine follow-up she is doing fair. She's been having some back problems has had an MRI scan showing some disc space narrowing at the L2 L4 region as well as disc desiccation at L4-5 with central protrusion. She also had a mammogram in August which I have reviewed will BI-RADS 2 benign. She specifically denies breast tenderness cough or bone pain.  COMPLICATIONS OF TREATMENT: none  FOLLOW UP COMPLIANCE: keeps appointments   PHYSICAL EXAM:  BP 117/72 (BP Location: Left Arm, Patient Position: Sitting)   Pulse 88   Temp (!) 96.9 F (36.1 C) (Tympanic)   Resp 18   Wt 114 lb 12 oz (52 kg)   BMI 22.41 kg/m  Lungs are clear to A&P cardiac examination essentially unremarkable with regular rate and rhythm. No dominant mass or nodularity is noted in either breast in 2 positions examined. Incision is well-healed. No axillary or supraclavicular adenopathy is appreciated. Cosmetic result is excellent.Well-developed well-nourished patient in NAD. HEENT reveals PERLA, EOMI, discs not visualized.  Oral cavity is clear. No oral mucosal lesions are identified. Neck is clear without evidence of cervical or supraclavicular adenopathy. Lungs are clear to A&P. Cardiac examination is essentially unremarkable with regular rate and rhythm without murmur rub or thrill. Abdomen is benign with no organomegaly or masses noted. Motor sensory and DTR levels are equal and symmetric in the upper and lower  extremities. Cranial nerves II through XII are grossly intact. Proprioception is intact. No peripheral adenopathy or edema is identified. No motor or sensory levels are noted. Crude visual fields are within normal range.  RADIOLOGY RESULTS: mammograms reviewed and compatible above-stated findings  PLAN: resent time patient is doing well from a breast standpoint with no evidence of disease.he is currently on Femara and tolerating that well. I've asked her to be her case to be reviewed by neurosurgery at the [facility] clinic. I've also asked to see her back in 1 year for follow-up. Patient continues on Femara without side effect. Patient knows to call with any concerns.  I would like to take this opportunity to thank you for allowing me to participate in the care of your patient.Noreene Filbert, MD

## 2018-11-11 DIAGNOSIS — I10 Essential (primary) hypertension: Secondary | ICD-10-CM | POA: Insufficient documentation

## 2018-12-30 ENCOUNTER — Encounter: Payer: Self-pay | Admitting: Emergency Medicine

## 2018-12-30 ENCOUNTER — Emergency Department: Payer: BLUE CROSS/BLUE SHIELD

## 2018-12-30 ENCOUNTER — Emergency Department
Admission: EM | Admit: 2018-12-30 | Discharge: 2018-12-30 | Disposition: A | Discharge status: Home / Self Care | Payer: BLUE CROSS/BLUE SHIELD | Attending: Emergency Medicine | Admitting: Emergency Medicine

## 2018-12-30 ENCOUNTER — Other Ambulatory Visit: Payer: Self-pay

## 2018-12-30 DIAGNOSIS — R079 Chest pain, unspecified: Secondary | ICD-10-CM

## 2018-12-30 DIAGNOSIS — Z79899 Other long term (current) drug therapy: Secondary | ICD-10-CM | POA: Insufficient documentation

## 2018-12-30 LAB — TROPONIN I
Troponin I: <0.03 ng/mL (ref <0.03)
Troponin I: <0.03 ng/mL (ref <0.03)

## 2018-12-30 LAB — BASIC METABOLIC PANEL
Anion gap: 7 (ref 5–15)
BUN: 18 mg/dL (ref 8–23)
CO2: 27 mmol/L (ref 22–32)
Calcium: 8.8 mg/dL — ABNORMAL LOW (ref 8.9–10.3)
Chloride: 106 mmol/L (ref 98–111)
Creatinine, Ser: 0.73 mg/dL (ref 0.44–1.00)
GFR calc Af Amer: >60 mL/min (ref >60)
GFR calc non Af Amer: >60 mL/min (ref >60)
GLUCOSE: 95 mg/dL (ref 70–99)
Potassium: 3.8 mmol/L (ref 3.5–5.1)
Sodium: 140 mmol/L (ref 135–145)

## 2018-12-30 LAB — CBC
HCT: 36.8 % (ref 36.0–46.0)
Hemoglobin: 12.1 g/dL (ref 12.0–15.0)
MCH: 28.7 pg (ref 26.0–34.0)
MCHC: 32.9 g/dL (ref 30.0–36.0)
MCV: 87.4 fL (ref 80.0–100.0)
Platelets: 266 10*3/uL (ref 150–400)
RBC: 4.21 MIL/uL (ref 3.87–5.11)
RDW: 13.3 % (ref 11.5–15.5)
WBC: 7.5 10*3/uL (ref 4.0–10.5)
nRBC: 0 % (ref 0.0–0.2)

## 2018-12-30 LAB — FIBRIN DERIVATIVES D-DIMER (ARMC ONLY): Fibrin derivatives D-dimer (ARMC): 364.94 ng/mL (FEU) (ref 0.00–499.00)

## 2018-12-30 NOTE — ED Provider Notes (Signed)
[facility] Emergency Department Provider Note  ____________________________________________   First MD Initiated Contact with Patient 12/30/18 1709     (approximate)  I have reviewed the triage vital signs and the nursing notes.   HISTORY  Chief Complaint Chest Pain   HPI Lauren Mayo is a 63 y.o. female with a history of HLD as well as history of left-sided breast cancer was presenting with left-sided chest pain over the past 24 hours.  Says that the pain is worse with deep breathing and is nonradiating.  Denies any shortness of breath.  No cough, nausea vomiting or diaphoresis.  Pain is not worsened with exertion.  Patient is not a smoker.  Says that she has been very stressed over the past 24 hours but does not know with a particular event and thinks this may be contributing to her pain.  Does not note any recent heavy lifting or injury.  Says the pain is reduced at this time and is a 1-2 out of 10.  Says the pain is a pressure-like pain.  No recent long car trips or plane trips.  Past Medical History:  Diagnosis Date  . Arthritis    RIGHT LEG  . BRCA negative 06-22-15   Dr Bary Castilla  . BRCA negative 2016  . Cancer of breast ([place]) 2016   INVASIVE LOBULAR CARCINOMA, T1b,N0. ER positive, PR negative, HER-2/neu not overexpressed. Lumpectomy c Radiation  . Family history of adverse reaction to anesthesia    SISTER-TROUBLE WAKING UP  . Hypercholesteremia   . Melanoma ([place])   . Personal history of radiation therapy 10/20/2015   left breast ca    Patient Active Problem List   Diagnosis Date Noted  . DDD (degenerative disc disease), cervical 01/08/2017  . Vitamin D deficiency disease 04/16/2016  . Malignant neoplasm of left female breast ([place]) 01/22/2016  . Atypical lobular hyperplasia of left breast 06/20/2015  . HLD (hyperlipidemia) 07/06/2014  . Cyst of ovary 07/06/2014    Past Surgical History:  Procedure Laterality Date  . ABDOMINAL  HYSTERECTOMY  1990  . [place]  . BREAST BIOPSY Left 06/16/2015   fibroadenoma and ADH 11:00 position  . BREAST BIOPSY Left 06/16/2015   INVASIVE LOBULAR CARCINOMA and invasive lobular insitu at 7:30 position  . BREAST LUMPECTOMY Left 07/12/2015   clear margins for removal of ADH and invasive lobular carcinoma. Lymph nodes neagive  . BREAST LUMPECTOMY WITH SENTINEL LYMPH NODE BIOPSY Left 07/12/2015   Procedure: BREAST WIDE EXCISION WITH SENTINEL LYMPH NODE BX, MASTOPLASTY ;  Surgeon: [name], MD;  Location: [facility] ORS;  Service: General;  Laterality: Left;  . BREAST SURGERY Left 07/13/2015    Wide excision, mastoplasty, sentinel node biopsy.  [name] Bilateral   . COLONOSCOPY  2012   [facility]  . LEG SKIN LESION  BIOPSY / EXCISION Left 2012,2015   melanoma    Prior to Admission medications   Medication Sig Start Date End Date Taking? Authorizing Provider  Calcium-Magnesium-Vitamin D (CALCIUM 500 PO) Take 2 tablets by mouth daily.   Yes [provider]  ibuprofen (ADVIL,MOTRIN) 200 MG tablet Take 200 mg by mouth every 6 (six) hours as needed.   Yes [provider]  letrozole (FEMARA) 2.5 MG tablet TAKE 1 TABLET (2.5 MG      TOTAL) DAILY. 09/07/18  Yes Lloyd Huger, MD  traZODone (DESYREL) 50 MG tablet Take 50 mg by mouth at bedtime.   Yes  [provider]  Vitamin D, Ergocalciferol, (DRISDOL) 50000 UNITS CAPS capsule Take 50,000 Units by mouth every 7 (seven) days.  04/12/15  Yes [provider]  vitamin E 400 UNIT capsule Take 400 Units by mouth 2 (two) times daily.   Yes [provider]  albuterol (PROVENTIL HFA) 108 (90 Base) MCG/ACT inhaler Inhale into the lungs. 11/22/17   [provider]  cyclobenzaprine (FLEXERIL) 10 MG tablet Take 10 mg by mouth 3 (three) times daily as needed for muscle spasms.    [provider]    Allergies Duloxetine  Family History  Problem  Relation Age of Onset  . Breast cancer Sister 24       2016  . Breast cancer Paternal Aunt 48  . Hypertension Mother   . Diabetes Father   . Cancer Maternal Aunt 63       ovarian    Social History Social History   Tobacco Use  . Smoking status: Never Smoker  . Smokeless tobacco: Never Used  Substance Use Topics  . Alcohol use: Yes    Alcohol/week: 0.0 standard drinks    Comment: OCC  . Drug use: No    Review of Systems  Constitutional: No fever/chills Eyes: No visual changes. ENT: No sore throat. Cardiovascular: As above Respiratory: Denies shortness of breath. Gastrointestinal: No abdominal pain.  No nausea, no vomiting.  No diarrhea.  No constipation. Genitourinary: Negative for dysuria. Musculoskeletal: Negative for back pain. Skin: Negative for rash. Neurological: Negative for headaches, focal weakness or numbness.   ____________________________________________   PHYSICAL EXAM:  VITAL SIGNS: ED Triage Vitals  Enc Vitals Group     BP 12/30/18 1612 122/60     Pulse Rate 12/30/18 1612 76     Resp 12/30/18 1612 18     Temp 12/30/18 1612 (!) 97.5 F (36.4 C)     Temp Source 12/30/18 1612 Oral     SpO2 12/30/18 1612 98 %     Weight 12/30/18 1612 114 lb (51.7 kg)     Height 12/30/18 1612 4' 11"  (1.499 m)     Head Circumference --      Peak Flow --      Pain Score 12/30/18 1620 6     Pain Loc --      Pain Edu? --      Excl. in [place]? --     Constitutional: Alert and oriented. Well appearing and in no acute distress. Eyes: Conjunctivae are normal.  Head: Atraumatic. Nose: No congestion/rhinnorhea. Mouth/Throat: Mucous membranes are moist.  Neck: No stridor.   Cardiovascular: Normal rate, regular rhythm. Grossly normal heart sounds.  Good peripheral circulation with equal and bilateral radial pulses.  Chest pain is not reproducible to palpation. Respiratory: Normal respiratory effort.  No retractions. Lungs CTAB. Gastrointestinal: Soft and nontender. No  distention. Musculoskeletal: No lower extremity tenderness nor edema.  No joint effusions. Neurologic:  Normal speech and language. No gross focal neurologic deficits are appreciated. Skin:  Skin is warm, dry and intact. No rash noted. Psychiatric: Mood and affect are normal. Speech and behavior are normal.  ____________________________________________   LABS (all labs ordered are listed, but only abnormal results are displayed)  Labs Reviewed  BASIC METABOLIC PANEL - Abnormal; Notable for the following components:      Result Value   Calcium 8.8 (*)    All other components within normal limits  CBC  TROPONIN I  TROPONIN I  FIBRIN DERIVATIVES D-DIMER ([facility] ONLY)   ____________________________________________  EKG  ED ECG REPORT I, Doran Stabler, the attending physician, personally viewed and interpreted this ECG.   Date: 12/30/2018  EKG Time: 1612  Rate: 78  Rhythm: normal sinus rhythm  Axis: Normal  Intervals:none  ST&T Change: No ST segment elevation or depression.  No abnormal T wave inversion.  ____________________________________________  RADIOLOGY  Chest x-ray without acute cardiopulmonary findings. ____________________________________________   PROCEDURES  Procedure(s) performed:   Procedures  Critical Care performed:   ____________________________________________   INITIAL IMPRESSION / ASSESSMENT AND PLAN / ED COURSE  Pertinent labs & imaging results that were available during my care of the patient were reviewed by me and considered in my medical decision making (see chart for details).  Differential diagnosis includes, but is not limited to, ACS, aortic dissection, pulmonary embolism, cardiac tamponade, pneumothorax, pneumonia, pericarditis, myocarditis, GI-related causes including esophagitis/gastritis, and musculoskeletal chest wall pain.   As part of my medical decision making, I reviewed the following data within the electronic medical  record:  Notes from prior outpatient visits  ----------------------------------------- 6:40 PM on 12/30/2018 -----------------------------------------  Patient at this time without further complaint.  2- troponins as well as a normal d-dimer.  Patient with family history of heart disease.  However, very atypical symptoms today with a very reassuring work-up.  Patient be referred to cardiology for 48 to 72-hour follow-up.  Patient knows to return to the emergency department for any worsening or concerning symptoms.  Will be discharged at this time. ____________________________________________   FINAL CLINICAL IMPRESSION(S) / ED DIAGNOSES  Chest pain.  NEW MEDICATIONS STARTED DURING THIS VISIT:  New Prescriptions   No medications on file     Note:  This document was prepared using Dragon voice recognition software and may include unintentional dictation errors.     Orbie Pyo, MD 12/30/18 [phone]

## 2018-12-30 NOTE — ED Notes (Signed)
NAD noted at time of D/C. Pt denies questions or concerns. Pt ambulatory to the lobby at this time.  

## 2018-12-30 NOTE — ED Triage Notes (Signed)
PT arrived with complaints of intermittent left sided chest pain/pressure that started yesterday. Pt reports she is here because one hour prior to arrival the pain intensified. Pt denies radiation of pain. Pt reports deep breaths increases pain.

## 2018-12-30 NOTE — ED Notes (Signed)
This RN to bedside, apologized for wait. Pt states understanding. Resting comfortably in bed reading a book at this time. Will continue to monitor for further patient needs.

## 2019-01-24 NOTE — Progress Notes (Signed)
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  INTERVAL HISTORY: Patient returns to clinic today for routine 51-monthfollow-up.  She currently feels well and is asymptomatic.  Her back pain is unchanged despite cortisone injections.  She continues to tolerate letrozole well without significant side effects. She has no neurologic complaints. She denies any recent fevers or illnesses. She has a good appetite and denies weight loss. She denies any chest pain or shortness of breath. She denies any nausea, vomiting, constipation, or diarrhea. She has no urinary complaints.  Patient feels at her baseline offers no further specific complaints today.  REVIEW OF SYSTEMS:   Review of Systems  Constitutional: Negative.  Negative for fever, malaise/fatigue and weight loss.  Respiratory: Negative.  Negative for cough and shortness of breath.   Cardiovascular: Negative.  Negative for chest pain and leg swelling.  Gastrointestinal: Negative.  Negative for abdominal pain.  Genitourinary: Negative.  Negative for dysuria.  Musculoskeletal: Positive for back pain.  Skin: Negative.  Negative for rash.  Neurological: Negative.  Negative for sensory change, weakness and headaches.  Psychiatric/Behavioral: Negative.  The patient is not nervous/anxious.     As per HPI. Otherwise, a complete review of systems is negative.  ONCOLOGY HISTORY:   Malignant ne[place] of left female breast ([place]   06/02/2015 Initial Diagnosis    Breast cancer of lower-inner quadrant of left female breast ([facility], T1 N0 M0, ER positive, PR negative, Her 2 negative      06/05/2015 Surgery    lumpectomy and SLN biopsy     - 10/20/2015 Radiation Therapy    XRT    10/20/2015 -  Anti-estrogen oral therapy    started on Letrozole     PAST MEDICAL HISTORY: Past Medical History:  Diagnosis Date  . Arthritis    RIGHT LEG  . BRCA negative 06-22-15   Dr BBary Castilla . BRCA negative 2016  . [place] of breast ([place]   INVASIVE LOBULAR CARCINOMA, T1b,N0. ER positive, PR negative, HER-2/neu not overexpressed. Lumpectomy c Radiation  . Family history of adverse reaction to anesthesia    SISTER-TROUBLE WAKING UP  . Hypercholesteremia   . [place]   . Personal history of radiation therapy 10/20/2015   left breast ca    PAST SURGICAL HISTORY: Past Surgical History:  Procedure Laterality Date  . ABDOMINAL HYSTERECTOMY  1990  . [place] . BREAST BIOPSY Left 06/16/2015   fibroadenoma and ADH 11:00 position  . BREAST BIOPSY Left 06/16/2015   INVASIVE LOBULAR CARCINOMA and invasive lobular insitu at 7:30 position  . BREAST LUMPECTOMY Left 07/12/2015   clear margins for removal of ADH and invasive lobular carcinoma. Lymph nodes neagive  . BREAST LUMPECTOMY WITH SENTINEL LYMPH NODE BIOPSY Left 07/12/2015   Procedure: BREAST WIDE EXCISION WITH SENTINEL LYMPH NODE BX, MASTOPLASTY ;  Surgeon: JRobert Bellow MD;  Location: [facility] ORS;  Service: General;  Laterality: Left;  . BREAST SURGERY Left 07/13/2015    Wide excision, mastoplasty, sentinel node biopsy.  .Lillard AnesBilateral   . COLONOSCOPY  2012   [facility]  . LEG SKIN LESION  BIOPSY / EXCISION Left 2012,2015   melanoma    FAMILY HISTORY Family History  Problem Relation Age of Onset  . Breast cancer Sister 58       2016  . Breast cancer Paternal Aunt 80  . Hypertension Mother   . Diabetes Father   . Cancer Maternal Aunt 63       ovarian    GYNECOLOGIC HISTORY:  No LMP recorded. Patient has had a hysterectomy.     ADVANCED DIRECTIVES:    HEALTH  MAINTENANCE: Social History   Tobacco Use  . Smoking status: Never Smoker  . Smokeless tobacco: Never Used  Substance Use Topics  . Alcohol use: Yes    Alcohol/week: 0.0 standard drinks    Comment: OCC  . Drug use: No     Colonoscopy:  Bone density:  Mammogram:July 2017  Allergies  Allergen Reactions  . Duloxetine Nausea Only    Dizziness, blurred vision    Current Outpatient Medications  Medication Sig Dispense Refill  . Calcium-Magnesium-Vitamin D (CALCIUM 500 PO) Take 2 tablets by mouth daily.    Marland Kitchen ibuprofen (ADVIL,MOTRIN) 200 MG tablet Take 200 mg by mouth every 6 (six) hours as needed.    Marland Kitchen letrozole (FEMARA) 2.5 MG tablet TAKE 1 TABLET (2.5 MG      TOTAL) DAILY. 90 tablet 1  . traZODone (DESYREL) 50 MG tablet Take 50 mg by mouth at bedtime.    . Vitamin D, Ergocalciferol, (DRISDOL) 50000 UNITS CAPS capsule Take 50,000 Units by mouth every 7 (seven) days.     . vitamin E 400 UNIT capsule Take 400 Units by mouth 2 (two) times daily.     No current facility-administered medications for this visit.     OBJECTIVE: BP 108/61 (BP Location: Right Arm, Patient Position: Sitting)   Pulse 66   Temp 97.8 F (36.6 C) (Tympanic)   Ht _0  (1.499 m)   Wt 117 lb (53.1 kg)   BMI 23.63 kg/m    Body mass index is 23.63 kg/m.    ECOG FS:0 - Asymptomatic  General: Well-developed, well-nourished, no acute distress. Eyes: Pink conjunctiva, anicteric sclera. HEENT: Normocephalic, moist mucous membranes. Breast: Patient request exam be deferred today. Lungs: Clear to auscultation bilaterally. Heart: Regular rate and rhythm. No rubs, murmurs, or gallops. Abdomen: Soft, nontender, nondistended. No organomegaly noted, normoactive bowel sounds. Musculoskeletal: No edema, cyanosis, or clubbing. Neuro: Alert, answering all questions appropriately. Cranial nerves grossly intact. Skin: No rashes or petechiae noted. Psych: Normal affect.  LAB RESULTS:  No visits with results within  3 Day(s) from this visit.  Latest known visit with results is:  Admission on 12/30/2018, Discharged on 12/30/2018  Component Date Value Ref Range Status  . Sodium 12/30/2018 140  135 - 145 mmol/L Final  . Potassium 12/30/2018 3.8  3.5 - 5.1 mmol/L Final  . Chloride 12/30/2018 106  98 - 111 mmol/L Final  . CO2 12/30/2018 27  22 - 32 mmol/L Final  . Glucose, Bld 12/30/2018 95  70 - 99 mg/dL Final  . BUN 12/30/2018 18  8 - 23 mg/dL Final  . Creatinine, Ser 12/30/2018 0.73  0.44 - 1.00 mg/dL Final  . Calcium 12/30/2018 8.8* 8.9 - 10.3 mg/dL Final  . GFR calc non Af Amer 12/30/2018 >60  >60 mL/min Final  . GFR calc Af Amer 12/30/2018 >60  >60 mL/min Final  . Anion gap 12/30/2018 7  5 - 15 Final   Performed at [facility], [address] [place], [place] [address]  . WBC 12/30/2018 7.5  4.0 - 10.5 K/uL Final  . RBC 12/30/2018 4.21  3.87 - 5.11 MIL/uL Final  . Hemoglobin 12/30/2018 12.1  12.0 - 15.0 g/dL Final  . HCT 12/30/2018 36.8  36.0 - 46.0 % Final  . MCV 12/30/2018 87.4  80.0 - 100.0 fL Final  . MCH 12/30/2018 28.7  26.0 - 34.0 pg Final  . MCHC 12/30/2018 32.9  30.0 - 36.0 g/dL Final  . RDW 12/30/2018 13.3  11.5 - 15.5 % Final  . Platelets 12/30/2018 266  150 - 400 K/uL Final  . nRBC 12/30/2018 0.0  0.0 - 0.2 % Final   Performed at [facility], [address], [place], [place] [address]  . Troponin I 12/30/2018 <0.03  <0.03 ng/mL Final   Performed at [facility], [address] [place], [place] [address]  . Troponin I 12/30/2018 <0.03  <0.03 ng/mL Final   Performed at [facility], [address] [place], [place] [address]  . Fibrin derivatives D-dimer ([facility]) 12/30/2018 364.94  0.00 - 499.00 ng/mL (FEU) Final   Comment: (NOTE) <> Exclusion of Venous Thromboembolism (VTE) - OUTPATIENT ONLY   (Emergency Department or [place])   0-499 ng/ml (FEU): With a low to intermediate pretest probability                      for VTE this test result  excludes the diagnosis                      of VTE.   >499 ng/ml (FEU) : VTE not excluded; additional work up for VTE is                      required. <> Testing on Inpatients and Evaluation of Disseminated Intravascular   Coagulation (DIC) Reference Range:   0-499 ng/ml (FEU) Performed at [facility], [address] [place], [place] [address]     STUDIES: No results found.  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   PLAN:    1. 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 She completed adjuvant XRT in November 2016.  Given the low stage of disease, she did not require chemotherapy.  Her most recent mammogram on July 06, 2018 was reported as BI-RADS 2.  Repeat in August 2020. Continue letrozole completing 5 years of treatment in December 2021.  Return to clinic in 6 months for routine evaluation. 2. Osteopenia: Patient's most recent bone mineral density on September 10, 2018 revealed a T score of -1.8 which is unchanged since September 2017.  Continue calcium and vitamin D supplementation.  Repeat in October 2020.    I spent a total of 20 minutes face-to-face with the patient of which greater than 50% of the visit was spent in counseling and coordination of care as detailed above.  Patient expressed understanding and was in agreement with this plan. She also understands that She can call clinic at any time with any questions, concerns, or complaints.   Breast cancer of lower-inner quadrant of left female breast [facility])   Staging form: Breast, AJCC 7th Edition     Clinical stage from 06/02/2015: Stage IA (T1, N0, M0) - Signed by Evlyn Kanner, NP on 04/18/2016  Lloyd Huger, MD   01/29/2019 11:24 PM

## 2019-01-28 ENCOUNTER — Inpatient Hospital Stay: Payer: BLUE CROSS/BLUE SHIELD | Attending: Oncology | Admitting: Oncology

## 2019-01-28 ENCOUNTER — Other Ambulatory Visit: Payer: Self-pay

## 2019-01-28 VITALS — BP 108/61 | HR 66 | Temp 97.8°F | Ht 59.0 in | Wt 117.0 lb

## 2019-01-28 DIAGNOSIS — Z79811 Long term (current) use of aromatase inhibitors: Secondary | ICD-10-CM

## 2019-01-28 DIAGNOSIS — Z923 Personal history of irradiation: Secondary | ICD-10-CM

## 2019-01-28 DIAGNOSIS — Z170 Estrogen receptor positive status [ER+]: Secondary | ICD-10-CM | POA: Diagnosis not present

## 2019-01-28 DIAGNOSIS — Z85820 Personal history of malignant melanoma of skin: Secondary | ICD-10-CM | POA: Insufficient documentation

## 2019-01-28 DIAGNOSIS — M8580 Other specified disorders of bone density and structure, unspecified site: Secondary | ICD-10-CM | POA: Diagnosis not present

## 2019-01-28 DIAGNOSIS — C50312 Malignant neoplasm of lower-inner quadrant of left female breast: Secondary | ICD-10-CM

## 2019-01-28 DIAGNOSIS — C50012 Malignant neoplasm of nipple and areola, left female breast: Secondary | ICD-10-CM

## 2019-01-28 NOTE — Progress Notes (Signed)
Patient is here today to follow up on her Malignant neoplasm of nipple of left breast. Patient denied any skin discoloration, nipple discharge or lumps/knots on bilateral breasts. Patient's mammogram was on 08/05/2019and it was benign and bone density was done on 09/10/2018.

## 2019-04-03 ENCOUNTER — Other Ambulatory Visit: Payer: Self-pay | Admitting: Oncology

## 2019-06-18 IMAGING — MR MR LUMBAR SPINE W/O CM
4 of 5 series · 25 of 48 positions shown · non-contrast
Comparison: None.

CLINICAL DATA: Low back pain for 6 weeks.  BILATERAL leg pain.

EXAM:
MRI LUMBAR SPINE WITHOUT CONTRAST
TECHNIQUE: Multiplanar, multisequence MR imaging of the lumbar spine was
performed. No intravenous contrast was administered.

[Series 2: T2 · sagittal · 4.0mm · 0.81mm/px · 6 of 15 slices shown (1 of 2)]
[im 1/15]
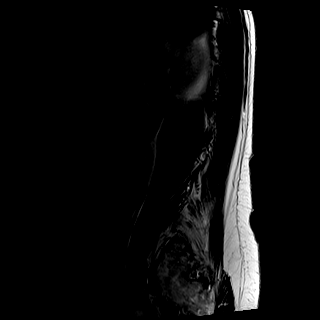
[im 3/15]
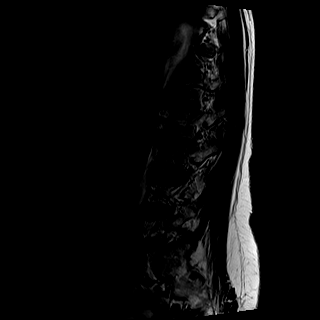
[im 6/15]
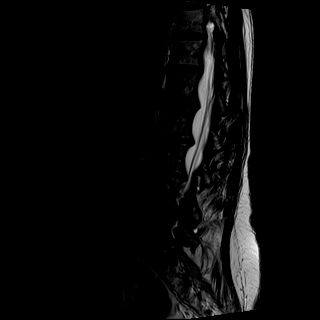
[im 9/15]
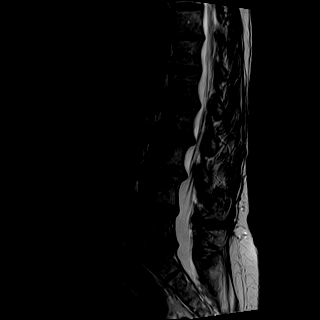
[im 12/15]
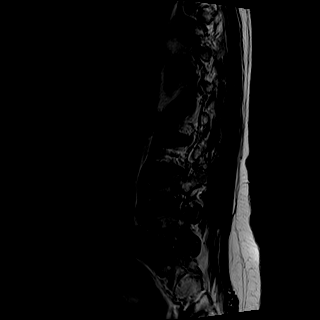
[im 15/15]
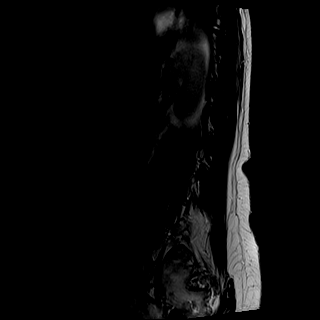

[Series 3: T1 · sagittal · 4.0mm · 0.41mm/px · 7 of 15 slices shown (1 of 2)]
[im 1/15]
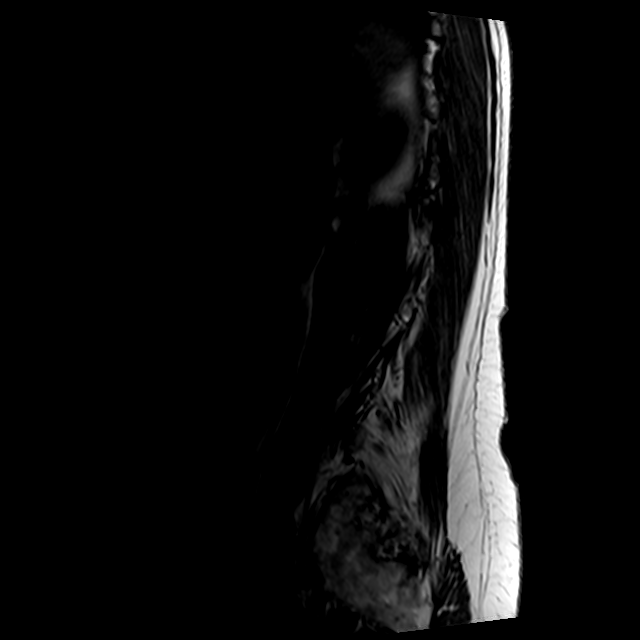
[im 3/15]
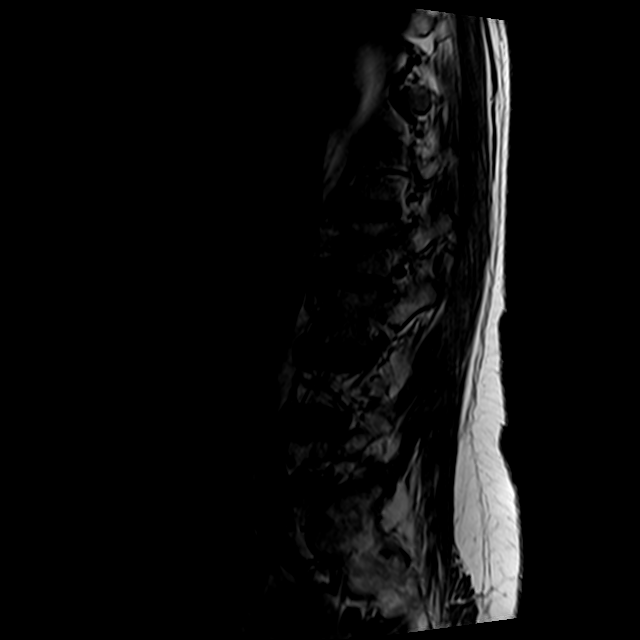
[im 5/15]
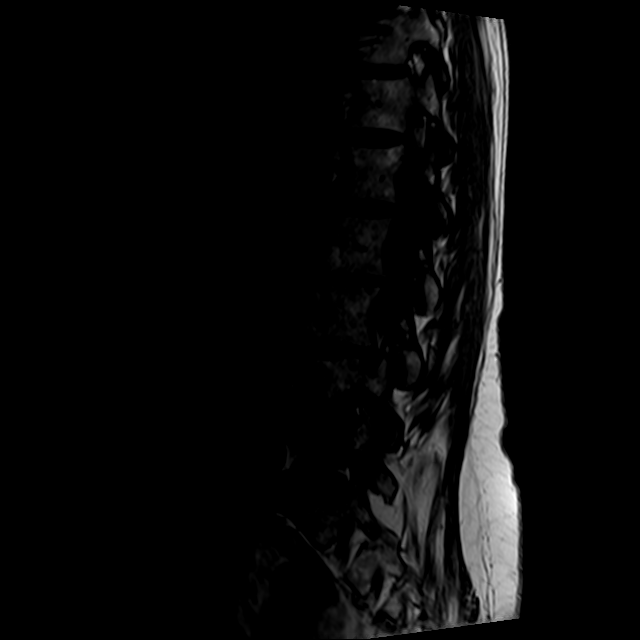
[im 8/15]
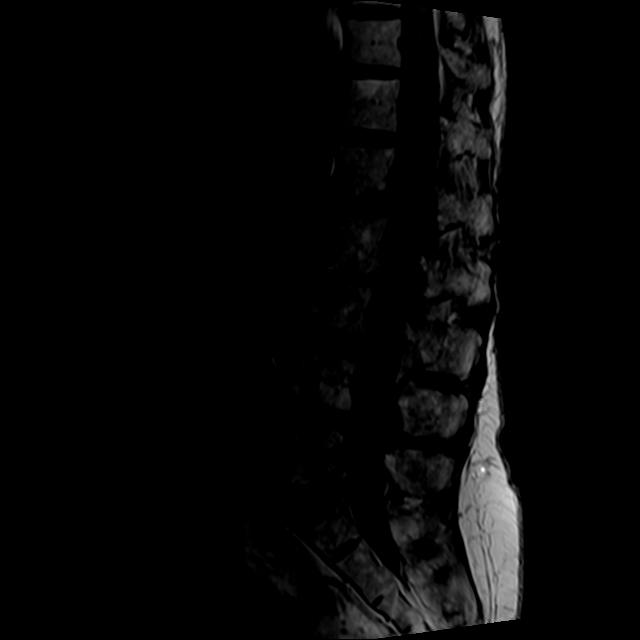
[im 10/15]
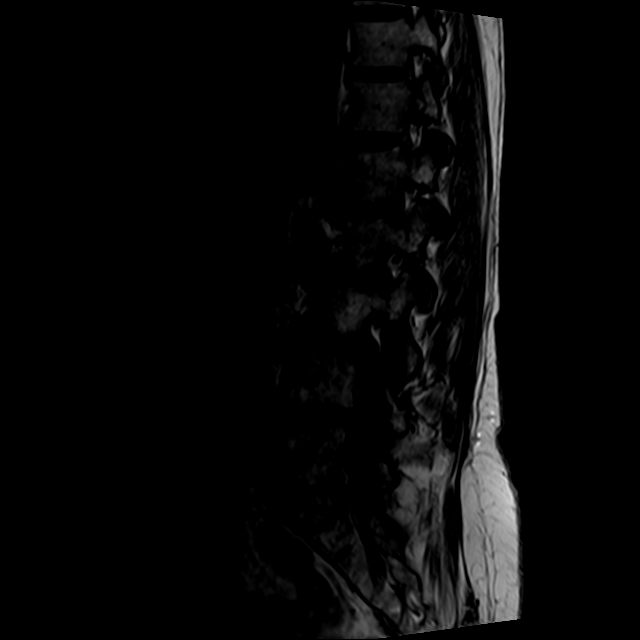
[im 12/15]
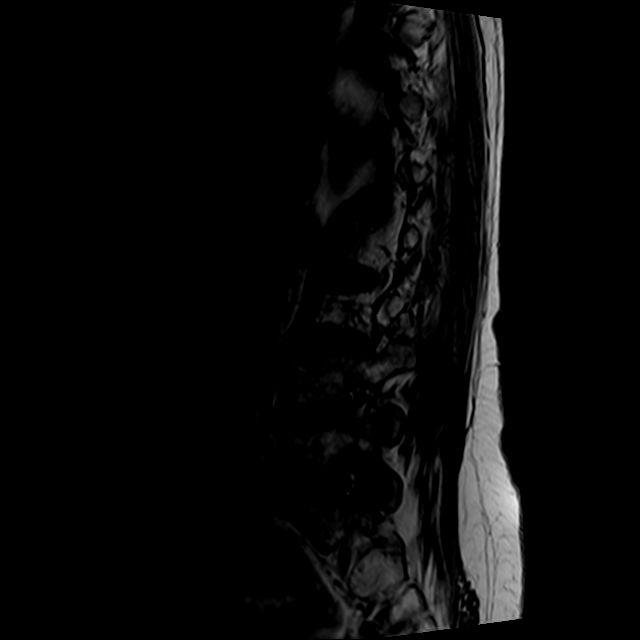
[im 15/15]
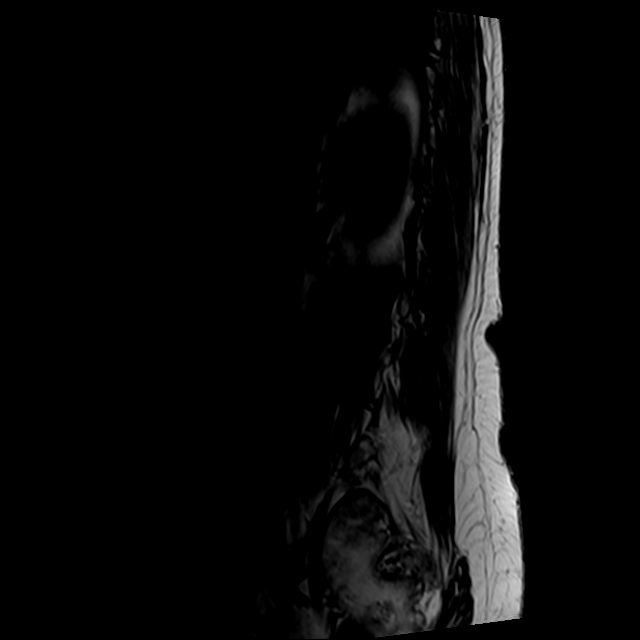

[Series 5: T2 · axial · 4.0mm · 0.78mm/px · z∈[-65,+107]mm · 8 of 32 slices shown (2 of 2)]
[im 1/32]
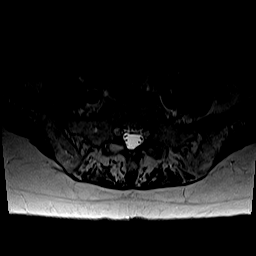
[im 5/32]
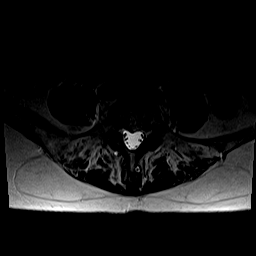
[im 10/32]
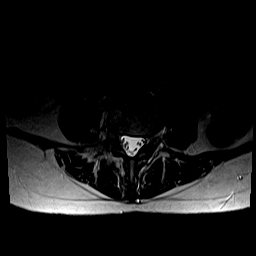
[im 15/32]
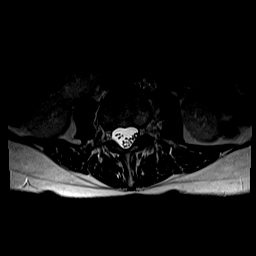
[im 17/32]
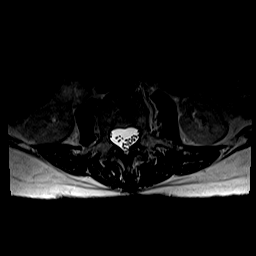
[im 22/32]
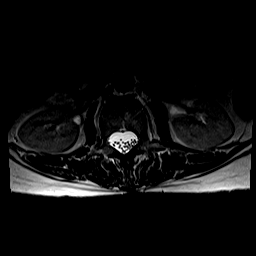
[im 27/32]
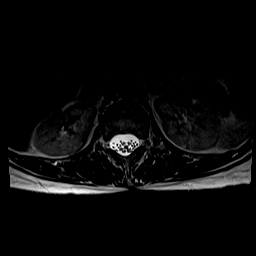
[im 32/32]
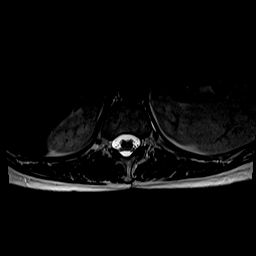

[Series 6: T1 · axial · 4.0mm · 0.39mm/px · z∈[-65,+82]mm · 4 of 32 slices shown (2 of 2)]
[im 1/32]
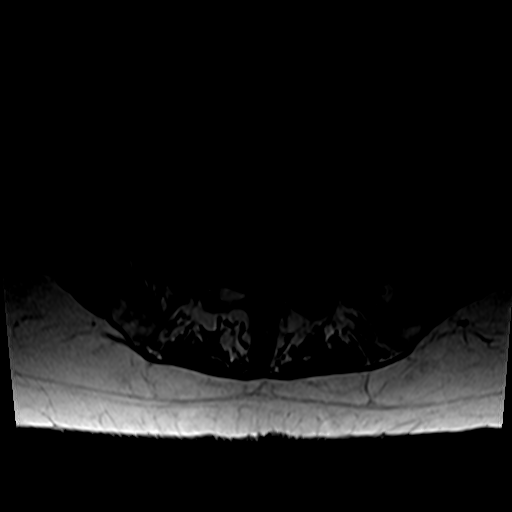
[im 5/32]
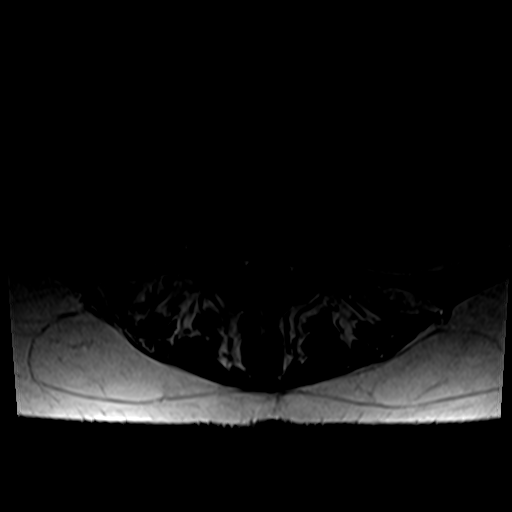
[im 17/32]
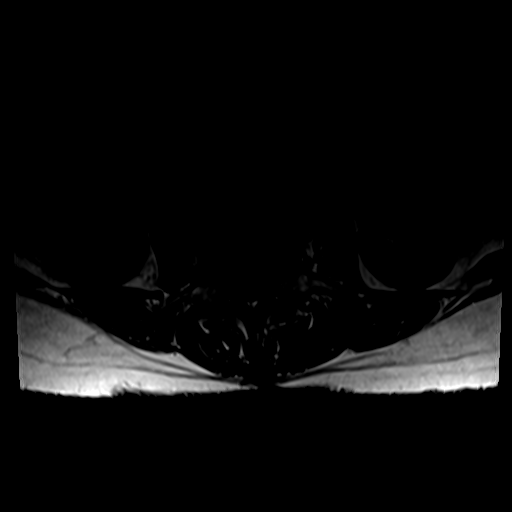
[im 27/32]
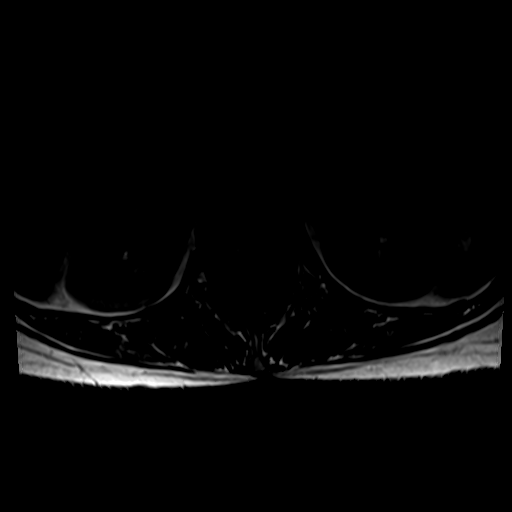

[25 of 48 positions shown; findings below may reference images not displayed]

FINDINGS: Segmentation: Standard.

Alignment: No significant anterolisthesis or retrolisthesis. Mild
degenerative scoliosis convex RIGHT upper lumbar region.

Vertebrae:  No worrisome osseous lesion.

Conus medullaris and cauda equina: Conus extends to the L1 level.
Conus and cauda equina appear normal.

Paraspinal and other soft tissues: Unremarkable

Disc levels:

L1-L2:  Annular bulge/shallow protrusion.  No impingement.

L2-L3: Disc space narrowing. Central protrusion. Facet arthropathy.
No impingement.

L3-L4: Advanced disc space narrowing. Central protrusion. Posterior
element hypertrophy. Mild stenosis. No definite subarticular zone
narrowing. BILATERAL foraminal narrowing could affect either L3
nerve root.

L4-L5: Disc desiccation. Central protrusion. Facet arthropathy. Disc
material extends to the RIGHT foramen. RIGHT L4 neural impingement
is possible.

L5-S1: Advanced disc space narrowing. Suspected remote LEFT
laminotomy for L5-S1 discectomy. Osseous spurring with central
protrusion. Facet arthropathy. No subarticular zone narrowing, but
BILATERAL foraminal narrowing could affect the RIGHT greater than
LEFT L5 nerve roots.
IMPRESSION: Multilevel spondylosis as described. No areas of spinal stenosis,
but foraminal narrowing at multiple levels could affect the exiting
nerve roots. See discussion above.

## 2019-07-08 ENCOUNTER — Other Ambulatory Visit: Payer: BLUE CROSS/BLUE SHIELD

## 2019-07-22 ENCOUNTER — Ambulatory Visit
Admission: RE | Admit: 2019-07-22 | Discharge: 2019-07-22 | Disposition: A | Discharge status: Home / Self Care | Payer: BC Managed Care – PPO | Source: Ambulatory Visit | Attending: Oncology | Admitting: Oncology

## 2019-07-22 ENCOUNTER — Other Ambulatory Visit: Payer: Self-pay

## 2019-07-22 DIAGNOSIS — C50012 Malignant neoplasm of nipple and areola, left female breast: Secondary | ICD-10-CM

## 2019-07-26 NOTE — Progress Notes (Signed)
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  INTERVAL HISTORY: Patient returns to clinic today for routine 90-monthevaluation.  She continues to tolerate letrozole well without significant side effects.  She currently feels well and is asymptomatic. She has no neurologic complaints. She denies any recent fevers or illnesses. She has a good appetite and denies weight loss.  She denies any chest pain, shortness of breath, cough, or hemoptysis.  She denies any nausea, vomiting, constipation, or diarrhea. She has no urinary complaints.  Patient offers no specific complaints today.  REVIEW OF SYSTEMS:   Review of Systems  Constitutional: Negative.  Negative for fever, malaise/fatigue and weight loss.  Respiratory: Negative.  Negative for cough and shortness of breath.   Cardiovascular: Negative.  Negative for chest pain and leg swelling.  Gastrointestinal: Negative.  Negative for abdominal pain.  Genitourinary: Negative.  Negative for dysuria.  Musculoskeletal: Negative.  Negative for back pain.  Skin: Negative.  Negative for rash.  Neurological: Negative.  Negative for sensory change, weakness and headaches.  Psychiatric/Behavioral: Negative.  The patient is not nervous/anxious.     As per HPI. Otherwise, a complete review of systems is negative.  ONCOLOGY HISTORY: Oncology History  Malignant [place] of left female breast ([facility] [place]  06/02/2015 Initial Diagnosis   Breast cancer of lower-inner quadrant of left female breast ([place], T1 N0 M0, ER positive, PR negative, Her 2 negative   06/05/2015 Surgery   lumpectomy and SLN  biopsy    - 10/20/2015 Radiation Therapy   XRT   10/20/2015 -  Anti-estrogen oral therapy   started on Letrozole     PAST MEDICAL HISTORY: Past Medical History:  Diagnosis Date  . Arthritis    RIGHT LEG  . BRCA negative 06-22-15   Dr BBary Castilla . BRCA negative 2016  . [place] of [place] ([place]   INVASIVE LOBULAR CARCINOMA, T1b,N0. ER positive, PR negative, HER-2/neu not overexpressed. Lumpectomy c Radiation  . Family history of adverse reaction to anesthesia    SISTER-TROUBLE WAKING UP  . Hypercholesteremia   . [place]an[place] ([place]   . Personal history of radiation therapy 10/20/2015   left breast ca    PAST SURGICAL HISTORY: Past Surgical History:  Procedure Laterality Date  . ABDOMINAL [place]STERECTOMY  1990  . [place] . BREAST BIOPSY Left 06/16/2015   fibroadenoma and ADH 11:00 position  . BREAST BIOPSY Left 06/16/2015   INVASIVE LOBULAR CARCINOMA and invasive lobular insitu at 7:30 position  . BREAST LUMPECTOMY Left 07/12/2015   clear margins for removal of ADH and invasive lobular carcinoma. Lymph nodes neagive  . BREAST LUMPECTOMY WITH SENTINEL LYMPH NODE BIOPSY Left 07/12/2015   Procedure: BREAST WIDE EXCISION WITH SENTINEL LYMPH NODE BX, MASTOPLASTY ;  Surgeon: JRobert Bellow MD;  Location: [facility] ORS;  Service: General;  Laterality: Left;  . BREAST SURGERY Left 07/13/2015    Wide excision, mastoplasty, sentinel node biopsy.  .Lillard AnesBilateral   . COLONOSCOPY  2012   [facility]  . LEG SKIN LESION  BIOPSY / EXCISION Left 2012,2015   melanoma    FAMILY HISTORY Family History  Problem Relation Age of Onset  . Breast cancer Sister 71       2016  . Breast cancer Paternal Aunt 30  . Hypertension Mother   . Diabetes Father   . Cancer Maternal Aunt 63       ovarian    GYNECOLOGIC HISTORY:  No LMP recorded. Patient has had a hysterectomy.     ADVANCED DIRECTIVES:    HEALTH MAINTENANCE: Social History   Tobacco Use  .  Smoking status: Never Smoker  . Smokeless tobacco: Never Used  Substance Use Topics  . Alcohol use: Yes    Alcohol/week: 0.0 standard drinks    Comment: OCC  . Drug use: No     Colonoscopy:  Bone density:  Mammogram:July 2017  Allergies  Allergen Reactions  . Duloxetine Nausea Only    Dizziness, blurred vision    Current Outpatient Medications  Medication Sig Dispense Refill  . Calcium-Magnesium-Vitamin D (CALCIUM 500 PO) Take 2 tablets by mouth daily.    Marland Kitchen etodolac (LODINE) 500 MG tablet Take by mouth.    Marland Kitchen ibuprofen (ADVIL,MOTRIN) 200 MG tablet Take 200 mg by mouth every 6 (six) hours as needed.    Marland Kitchen letrozole (FEMARA) 2.5 MG tablet TAKE 1 TABLET (2.5 MG      TOTAL) DAILY. 90 tablet 3  . traZODone (DESYREL) 50 MG tablet Take 50 mg by mouth at bedtime.    . Vitamin D, Ergocalciferol, (DRISDOL) 50000 UNITS CAPS capsule Take 50,000 Units by mouth every 7 (seven) days.     . vitamin E 400 UNIT capsule Take 400 Units by mouth 2 (two) times daily.     No current facility-administered medications for this visit.     OBJECTIVE: BP 105/68   Pulse 73   Temp (!) 96.8 F (36 C)   Resp 16   Wt 118 lb 6.4 oz (53.7 kg)   BMI 23.91 kg/m    Body mass index is 23.91 kg/m.    ECOG FS:0 - Asymptomatic  General: Well-developed, well-nourished, no acute distress. Eyes: Pink conjunctiva, anicteric sclera. HEENT: Normocephalic, moist mucous membranes. Breast: Patient requested exam deferred today. Lungs: Clear to auscultation bilaterally. Heart: Regular rate and rhythm. No rubs, murmurs, or gallops. Abdomen: Soft, nontender, nondistended. No organomegaly noted, normoactive bowel sounds. Musculoskeletal: No edema, cyanosis, or clubbing. Neuro: Alert, answering all questions appropriately. Cranial nerves grossly intact. Skin: No rashes or petechiae noted. Psych: Normal affect.  LAB RESULTS:  No visits with results within 3 Day(s) from this visit.  Latest known visit with results is:   Admission on 12/30/2018, Discharged on 12/30/2018  Component Date Value Ref Range Status  . Sodium 12/30/2018 140  135 - 145 mmol/L Final  . Potassium 12/30/2018 3.8  3.5 - 5.1 mmol/L Final  . Chloride 12/30/2018 106  98 - 111 mmol/L Final  . CO2 12/30/2018 27  22 - 32 mmol/L Final  . Glucose, Bld 12/30/2018 95  70 - 99 mg/dL Final  . BUN 12/30/2018 18  8 - 23 mg/dL Final  . Creatinine, Ser 12/30/2018 0.73  0.44 - 1.00 mg/dL Final  . Calcium 12/30/2018 8.8* 8.9 - 10.3 mg/dL Final  . GFR calc non Af Amer 12/30/2018 >60  >60 mL/min Final  . GFR calc Af Amer 12/30/2018 >60  >60 mL/min Final  . Anion gap 12/30/2018 7  5 - 15 Final   Performed at [facility], [address] [place], [place] [address]  . WBC 12/30/2018 7.5  4.0 - 10.5  K/uL Final  . RBC 12/30/2018 4.21  3.87 - 5.11 MIL/uL Final  . Hemoglobin 12/30/2018 12.1  12.0 - 15.0 g/dL Final  . HCT 12/30/2018 36.8  36.0 - 46.0 % Final  . MCV 12/30/2018 87.4  80.0 - 100.0 fL Final  . MCH 12/30/2018 28.7  26.0 - 34.0 pg Final  . MCHC 12/30/2018 32.9  30.0 - 36.0 g/dL Final  . RDW 12/30/2018 13.3  11.5 - 15.5 % Final  . Platelets 12/30/2018 266  150 - 400 K/uL Final  . nRBC 12/30/2018 0.0  0.0 - 0.2 % Final   Performed at [facility], [address], [place], [place] [address]  . Troponin I 12/30/2018 <0.03  <0.03 ng/mL Final   Performed at [facility], [address] [place], [place] [address]  . Troponin I 12/30/2018 <0.03  <0.03 ng/mL Final   Performed at [facility], [address] [place], [place] [address]  . Fibrin derivatives D-dimer ([facility]) 12/30/2018 364.94  0.00 - 499.00 ng/mL (FEU) Final   Comment: (NOTE) <> Exclusion of Venous Thromboembolism (VTE) - OUTPATIENT ONLY   (Emergency Department or [place])   0-499 ng/ml (FEU): With a low to intermediate pretest probability                      for VTE this test result excludes the diagnosis                      of VTE.   >499  ng/ml (FEU) : VTE not excluded; additional work up for VTE is                      required. <> Testing on Inpatients and Evaluation of Disseminated Intravascular   Coagulation (DIC) Reference Range:   0-499 ng/ml (FEU) Performed at [facility], [address] [place], [place] [address]     STUDIES: No results found.  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   PLAN:    1. 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 She completed adjuvant XRT in November 2016.  Given the low stage of disease, she did not require chemotherapy.  Continue letrozole for total 5 years completing in December 2021.  Patient's most recent mammogram on July 22, 2019 was reported as BI-RADS 2.  Repeat in August 2021.  Return to clinic in 6 months for routine evaluation.   2. Osteopenia: Patient's most recent bone mineral density on September 10, 2018 revealed a T score of -1.8 which is unchanged since September 2017.  Continue calcium and vitamin D supplementation.  Repeat in October 2020.  Return to clinic as above.  I spent a total of 20 minutes face-to-face with the patient of which greater than 50% of the visit was spent in counseling and coordination of care as detailed above.  Patient expressed understanding and was in agreement with this plan. She also understands that She can call clinic at any time with any questions, concerns, or complaints.   Breast cancer of lower-inner quadrant of left female breast [facility])   Staging form: Breast, AJCC 7th Edition     Clinical stage from 06/02/2015: Stage IA (T1, N0, M0) - Signed by Evlyn Kanner, NP on 04/18/2016   Lloyd Huger, MD  07/30/2019 6:37 AM

## 2019-07-29 ENCOUNTER — Inpatient Hospital Stay: Payer: BC Managed Care – PPO | Attending: Oncology | Admitting: Oncology

## 2019-07-29 ENCOUNTER — Other Ambulatory Visit: Payer: Self-pay

## 2019-07-29 ENCOUNTER — Encounter: Payer: Self-pay | Admitting: Oncology

## 2019-07-29 VITALS — BP 105/68 | HR 73 | Temp 96.8°F | Resp 16 | Wt 118.4 lb

## 2019-07-29 DIAGNOSIS — Z79899 Other long term (current) drug therapy: Secondary | ICD-10-CM | POA: Diagnosis not present

## 2019-07-29 DIAGNOSIS — Z170 Estrogen receptor positive status [ER+]: Secondary | ICD-10-CM | POA: Insufficient documentation

## 2019-07-29 DIAGNOSIS — C50312 Malignant neoplasm of lower-inner quadrant of left female breast: Secondary | ICD-10-CM | POA: Diagnosis present

## 2019-07-29 DIAGNOSIS — Z923 Personal history of irradiation: Secondary | ICD-10-CM | POA: Diagnosis not present

## 2019-07-29 DIAGNOSIS — C50012 Malignant neoplasm of nipple and areola, left female breast: Secondary | ICD-10-CM | POA: Diagnosis not present

## 2019-07-29 DIAGNOSIS — M8580 Other specified disorders of bone density and structure, unspecified site: Secondary | ICD-10-CM | POA: Diagnosis not present

## 2019-07-29 DIAGNOSIS — Z79811 Long term (current) use of aromatase inhibitors: Secondary | ICD-10-CM | POA: Diagnosis not present

## 2019-07-29 NOTE — Progress Notes (Signed)
Patient does not offer any problems today.  

## 2019-09-13 ENCOUNTER — Ambulatory Visit
Admission: RE | Admit: 2019-09-13 | Discharge: 2019-09-13 | Disposition: A | Discharge status: Home / Self Care | Payer: BC Managed Care – PPO | Source: Ambulatory Visit | Attending: Oncology | Admitting: Oncology

## 2019-09-13 DIAGNOSIS — C50012 Malignant neoplasm of nipple and areola, left female breast: Secondary | ICD-10-CM

## 2019-11-05 ENCOUNTER — Ambulatory Visit: Payer: 59 | Admitting: Radiation Oncology

## 2019-11-17 ENCOUNTER — Other Ambulatory Visit: Payer: Self-pay | Admitting: Internal Medicine

## 2019-11-17 DIAGNOSIS — M5442 Lumbago with sciatica, left side: Secondary | ICD-10-CM

## 2019-11-27 ENCOUNTER — Ambulatory Visit: Payer: BC Managed Care – PPO

## 2019-11-29 ENCOUNTER — Ambulatory Visit
Admission: RE | Admit: 2019-11-29 | Discharge: 2019-11-29 | Disposition: A | Discharge status: Home / Self Care | Payer: BC Managed Care – PPO | Source: Ambulatory Visit | Attending: Internal Medicine | Admitting: Internal Medicine

## 2019-11-29 ENCOUNTER — Other Ambulatory Visit: Payer: Self-pay

## 2019-11-29 DIAGNOSIS — M5442 Lumbago with sciatica, left side: Secondary | ICD-10-CM | POA: Diagnosis present

## 2020-01-15 ENCOUNTER — Ambulatory Visit: Payer: BC Managed Care – PPO | Attending: Internal Medicine

## 2020-01-15 DIAGNOSIS — Z23 Encounter for immunization: Secondary | ICD-10-CM | POA: Insufficient documentation

## 2020-01-15 NOTE — Progress Notes (Signed)
   Covid-19 Vaccination Clinic  Name:  Lauren Mayo    MRN: [id number] DOB: 1956/05/10  01/15/2020  [name] was observed post Covid-19 immunization for 15 minutes without incidence. She was provided with Vaccine Information Sheet and instruction to access the V-Safe system.   [name] was instructed to call 911 with any severe reactions post vaccine: Marland Kitchen Difficulty breathing  . Swelling of your face and throat  . A fast heartbeat  . A bad rash all over your body  . Dizziness and weakness    Immunizations Administered    Name Date Dose VIS Date Route   Pfizer COVID-19 Vaccine 01/15/2020  8:47 AM 0.3 mL 11/12/2019 Intramuscular   Manufacturer: [place]   Lot: [id number]   [place]: [id number]

## 2020-01-29 NOTE — Progress Notes (Signed)
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  INTERVAL HISTORY: Patient returns to clinic today for routine 55-monthevaluation she currently feels well and is asymptomatic.  She is tolerating letrozole without significant side effects.  She has no neurologic complaints. She denies any recent fevers or illnesses. She has a good appetite and denies weight loss.  She denies any chest pain, shortness of breath, cough, or hemoptysis.  She denies any nausea, vomiting, constipation, or diarrhea. She has no urinary complaints.  Patient offers no specific complaints today.  REVIEW OF SYSTEMS:   Review of Systems  Constitutional: Negative.  Negative for fever, malaise/fatigue and weight loss.  Respiratory: Negative.  Negative for cough and shortness of breath.   Cardiovascular: Negative.  Negative for chest pain and leg swelling.  Gastrointestinal: Negative.  Negative for abdominal pain.  Genitourinary: Negative.  Negative for dysuria.  Musculoskeletal: Negative.  Negative for back pain.  Skin: Negative.  Negative for rash.  Neurological: Negative.  Negative for sensory change, weakness and headaches.  Psychiatric/Behavioral: Negative.  The patient is not nervous/anxious.     As per HPI. Otherwise, a complete review of systems is negative.  ONCOLOGY HISTORY: Oncology History  Malignant neoplasm of left female breast ([place]  06/02/2015 Initial Diagnosis   Breast cancer of lower-inner quadrant of left female breast ([facility], T1 N0 M0, ER positive, PR negative, Her 2 negative   06/05/2015 Surgery   lumpectomy and SLN biopsy    -  10/20/2015 Radiation Therapy   XRT   10/20/2015 -  Anti-estrogen oral therapy   started on Letrozole     PAST MEDICAL HISTORY: Past Medical History:  Diagnosis Date  . Arthritis    RIGHT LEG  . BRCA negative 06-22-15   Dr BBary Castilla . BRCA negative 2016  . Cancer of breast ([place]-Left   INVASIVE LOBULAR CARCINOMA, T1b,N0. ER positive, PR negative, HER-2/neu not overexpressed. Lumpectomy c Radiation  . Family history of adverse reaction to anesthesia    SISTER-TROUBLE WAKING UP  . Hypercholesteremia   . Melanoma ([facility]   . Personal history of radiation therapy 10/20/2015   left breast ca    PAST SURGICAL HISTORY: Past Surgical History:  Procedure Laterality Date  . ABDOMINAL [place]CTOMY  [place] . BREAST BIOPSY Left 06/16/2015   fibroadenoma and ADH 11:00 position  . BREAST BIOPSY Left 06/16/2015   INVASIVE LOBULAR CARCINOMA and invasive lobular insitu at 7:30 position  . BREAST LUMPECTOMY Left 07/12/2015   clear margins for removal of ADH and invasive lobular carcinoma. Lymph nodes neagive  . BREAST LUMPECTOMY WITH SENTINEL LYMPH NODE BIOPSY Left 07/12/2015   Procedure: BREAST WIDE EXCISION WITH SENTINEL LYMPH NODE BX, MASTOPLASTY ;  Surgeon: JRobert Bellow MD;  Location: [facility] ORS;  Service: General;  Laterality: Left;  . BREAST SURGERY Left 07/13/2015    Wide excision, mastoplasty, sentinel node biopsy.  .Lillard AnesBilateral   . COLONOSCOPY  2012   [facility]  . LEG SKIN LESION  BIOPSY / EXCISION Left 2012,2015   melanoma    FAMILY HISTORY Family History  Problem Relation  Age of Onset  . Breast cancer Sister 75       2016  . Breast cancer Paternal Aunt 81  . Hypertension Mother   . Diabetes Father   . Cancer Maternal Aunt 63       ovarian    GYNECOLOGIC HISTORY:  No LMP recorded. Patient has had a hysterectomy.     ADVANCED DIRECTIVES:    HEALTH MAINTENANCE: Social History   Tobacco Use  . Smoking  status: Never Smoker  . Smokeless tobacco: Never Used  Substance Use Topics  . Alcohol use: Yes    Alcohol/week: 0.0 standard drinks    Comment: OCC  . Drug use: No     Colonoscopy:  Bone density:  Mammogram:July 2017  Allergies  Allergen Reactions  . Duloxetine Nausea Only    Dizziness, blurred vision    Current Outpatient Medications  Medication Sig Dispense Refill  . Calcium-Magnesium-Vitamin D (CALCIUM 500 PO) Take 2 tablets by mouth daily.    Marland Kitchen ibuprofen (ADVIL,MOTRIN) 200 MG tablet Take 200 mg by mouth every 6 (six) hours as needed.    Marland Kitchen letrozole (FEMARA) 2.5 MG tablet TAKE 1 TABLET (2.5 MG      TOTAL) DAILY. 90 tablet 3  . traZODone (DESYREL) 50 MG tablet Take 50 mg by mouth at bedtime.    . Vitamin D, Ergocalciferol, (DRISDOL) 50000 UNITS CAPS capsule Take 50,000 Units by mouth every 7 (seven) days.     . vitamin E 400 UNIT capsule Take 400 Units by mouth 2 (two) times daily.    Marland Kitchen etodolac (LODINE) 500 MG tablet Take by mouth.     No current facility-administered medications for this visit.    OBJECTIVE: BP 118/64 (BP Location: Right Arm, Patient Position: Sitting, Cuff Size: Normal)   Pulse 79   Temp 98 F (36.7 C) (Tympanic)   Wt 118 lb (53.5 kg)   BMI 23.83 kg/m    Body mass index is 23.83 kg/m.    ECOG FS:0 - Asymptomatic  General: Well-developed, well-nourished, no acute distress. Eyes: Pink conjunctiva, anicteric sclera. HEENT: Normocephalic, moist mucous membranes. Breast: Exam deferred today. Lungs: No audible wheezing or coughing. Heart: Regular rate and rhythm. Abdomen: Soft, nontender, no obvious distention. Musculoskeletal: No edema, cyanosis, or clubbing. Neuro: Alert, answering all questions appropriately. Cranial nerves grossly intact. Skin: No rashes or petechiae noted. Psych: Normal affect.   LAB RESULTS:  No visits with results within 3 Day(s) from this visit.  Latest known visit with results is:  Admission on 12/30/2018,  Discharged on 12/30/2018  Component Date Value Ref Range Status  . Sodium 12/30/2018 140  135 - 145 mmol/L Final  . Potassium 12/30/2018 3.8  3.5 - 5.1 mmol/L Final  . Chloride 12/30/2018 106  98 - 111 mmol/L Final  . CO2 12/30/2018 27  22 - 32 mmol/L Final  . Glucose, Bld 12/30/2018 95  70 - 99 mg/dL Final  . BUN 12/30/2018 18  8 - 23 mg/dL Final  . Creatinine, Ser 12/30/2018 0.73  0.44 - 1.00 mg/dL Final  . Calcium 12/30/2018 8.8* 8.9 - 10.3 mg/dL Final  . GFR calc non Af Amer 12/30/2018 >60  >60 mL/min Final  . GFR calc Af Amer 12/30/2018 >60  >60 mL/min Final  . Anion gap 12/30/2018 7  5 - 15 Final   Performed at [facility], [address], [place], [place] [address]  . WBC 12/30/2018 7.5  4.0 - 10.5 K/uL Final  . RBC 12/30/2018 4.21  3.87 - 5.11 MIL/uL Final  . Hemoglobin 12/30/2018 12.1  12.0 - 15.0 g/dL Final  . HCT 12/30/2018 36.8  36.0 - 46.0 % Final  . MCV 12/30/2018 87.4  80.0 - 100.0 fL Final  . MCH 12/30/2018 28.7  26.0 - 34.0 pg Final  . MCHC 12/30/2018 32.9  30.0 - 36.0 g/dL Final  . RDW 12/30/2018 13.3  11.5 - 15.5 % Final  . Platelets 12/30/2018 266  150 - 400 K/uL Final  . nRBC 12/30/2018 0.0  0.0 - 0.2 % Final   Performed at [facility], [address], [place], [place] [address]  . Troponin I 12/30/2018 <0.03  <0.03 ng/mL Final   Performed at [facility], [address] [place], [place] [address]  . Troponin I 12/30/2018 <0.03  <0.03 ng/mL Final   Performed at [facility], [address] [place], [place] [address]  . Fibrin derivatives D-dimer ([facility]) 12/30/2018 364.94  0.00 - 499.00 ng/mL (FEU) Final   Comment: (NOTE) <> Exclusion of Venous Thromboembolism (VTE) - OUTPATIENT ONLY   (Emergency Department or [place])   0-499 ng/ml (FEU): With a low to intermediate pretest probability                      for VTE this test result excludes the diagnosis                      of VTE.   >499 ng/ml (FEU) : VTE not excluded;  additional work up for VTE is                      required. <> Testing on Inpatients and Evaluation of Disseminated Intravascular   Coagulation (DIC) Reference Range:   0-499 ng/ml (FEU) Performed at [facility], [address] [place], [place] [address]     STUDIES: No results found.  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   PLAN:    1. 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 She completed adjuvant XRT in November 2016.  Given the low stage of disease, she did not require chemotherapy.  Continue letrozole for a total of 5 years completing in December 2021.  Patient's most recent mammogram on July 22, 2019 was reported as BI-RADS 2.  Repeat mammogram in August 2021.  Patient will return to clinic in December 2021 at the conclusion of her 5 years of letrozole for further evaluation.  At that point, she can likely be switched to yearly visits. 2. Osteopenia: Patient's most recent bone mineral density on September 13, 2019 reported T score of -2.1.  This is slightly worse than 1 year prior to the T score was reported -1.8.  Continue calcium and vitamin D.  Repeat in October 2021.  If her T score continues to climb, may consider initiating Fosamax at that point.    Patient expressed understanding and was in agreement with this plan. She also understands that She can call clinic at any time with any questions, concerns, or complaints.   Breast cancer of lower-inner quadrant of left female breast [facility])   Staging form: Breast, AJCC 7th Edition     Clinical stage from 06/02/2015: Stage IA (T1, N0, M0) - Signed by Evlyn Kanner, NP on 04/18/2016   Lauren Reading  Bridget Hartshorn, MD   02/04/2020 9:24 AM

## 2020-02-03 ENCOUNTER — Inpatient Hospital Stay: Payer: BC Managed Care – PPO | Attending: Oncology | Admitting: Oncology

## 2020-02-03 ENCOUNTER — Other Ambulatory Visit: Payer: Self-pay

## 2020-02-03 VITALS — BP 118/64 | HR 79 | Temp 98.0°F | Wt 118.0 lb

## 2020-02-03 DIAGNOSIS — Z79811 Long term (current) use of aromatase inhibitors: Secondary | ICD-10-CM | POA: Insufficient documentation

## 2020-02-03 DIAGNOSIS — M8580 Other specified disorders of bone density and structure, unspecified site: Secondary | ICD-10-CM | POA: Insufficient documentation

## 2020-02-03 DIAGNOSIS — C50312 Malignant neoplasm of lower-inner quadrant of left female breast: Secondary | ICD-10-CM | POA: Diagnosis present

## 2020-02-03 DIAGNOSIS — Z923 Personal history of irradiation: Secondary | ICD-10-CM | POA: Insufficient documentation

## 2020-02-03 DIAGNOSIS — Z85820 Personal history of malignant melanoma of skin: Secondary | ICD-10-CM | POA: Insufficient documentation

## 2020-02-03 DIAGNOSIS — Z170 Estrogen receptor positive status [ER+]: Secondary | ICD-10-CM | POA: Insufficient documentation

## 2020-02-03 DIAGNOSIS — C50012 Malignant neoplasm of nipple and areola, left female breast: Secondary | ICD-10-CM | POA: Diagnosis not present

## 2020-02-03 NOTE — Progress Notes (Signed)
Patient is here for follow up she is doing well no complaints  

## 2020-02-06 ENCOUNTER — Ambulatory Visit: Payer: BC Managed Care – PPO | Attending: Internal Medicine

## 2020-02-06 DIAGNOSIS — Z23 Encounter for immunization: Secondary | ICD-10-CM | POA: Insufficient documentation

## 2020-02-06 NOTE — Progress Notes (Signed)
   Covid-19 Vaccination Clinic  Name:  Lauren Mayo    MRN: [id number] DOB: Oct 14, 1956  02/06/2020  [name] was observed post Covid-19 immunization for 15 minutes without incident. She was provided with Vaccine Information Sheet and instruction to access the V-Safe system.   [name] was instructed to call 911 with any severe reactions post vaccine: Marland Kitchen Difficulty breathing  . Swelling of face and throat  . A fast heartbeat  . A bad rash all over body  . Dizziness and weakness   Immunizations Administered    Name Date Dose VIS Date Route   Pfizer COVID-19 Vaccine 02/06/2020 10:35 AM 0.3 mL 11/12/2019 Intramuscular   Manufacturer: [place]   Lot: [id number]   [place]: [id number]

## 2020-03-16 DIAGNOSIS — G8929 Other chronic pain: Secondary | ICD-10-CM | POA: Insufficient documentation

## 2020-03-22 ENCOUNTER — Other Ambulatory Visit: Payer: Self-pay | Admitting: *Deleted

## 2020-03-22 MED ORDER — LETROZOLE 2.5 MG PO TABS: ORAL_TABLET | ORAL | 1 refills | Status: DC

## 2020-07-25 ENCOUNTER — Other Ambulatory Visit: Payer: Self-pay

## 2020-07-25 ENCOUNTER — Ambulatory Visit
Admission: RE | Admit: 2020-07-25 | Discharge: 2020-07-25 | Disposition: A | Discharge status: Home / Self Care | Payer: Medicare Other | Source: Ambulatory Visit | Attending: Oncology | Admitting: Oncology

## 2020-07-25 DIAGNOSIS — C50012 Malignant neoplasm of nipple and areola, left female breast: Secondary | ICD-10-CM | POA: Diagnosis not present

## 2020-08-17 ENCOUNTER — Other Ambulatory Visit: Payer: Self-pay | Admitting: Internal Medicine

## 2020-08-17 DIAGNOSIS — R1032 Left lower quadrant pain: Secondary | ICD-10-CM

## 2020-08-25 ENCOUNTER — Ambulatory Visit
Admission: RE | Admit: 2020-08-25 | Discharge: 2020-08-25 | Disposition: A | Discharge status: Home / Self Care | Payer: Medicare Other | Source: Ambulatory Visit | Attending: Internal Medicine | Admitting: Internal Medicine

## 2020-08-25 ENCOUNTER — Other Ambulatory Visit: Payer: Self-pay

## 2020-08-25 DIAGNOSIS — R1032 Left lower quadrant pain: Secondary | ICD-10-CM

## 2020-08-25 MED ORDER — IOHEXOL 300 MG/ML  SOLN
75.0000 mL | Freq: Once | INTRAMUSCULAR | Status: AC | PRN
  Administered 2020-08-25: 75 mL via INTRAVENOUS

## 2020-10-01 ENCOUNTER — Other Ambulatory Visit: Payer: Self-pay | Admitting: Oncology

## 2020-10-02 ENCOUNTER — Encounter: Payer: Self-pay | Admitting: Oncology

## 2020-10-25 NOTE — Progress Notes (Signed)
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  INTERVAL HISTORY: Patient returns to clinic today for routine 63-monthevaluation. She continues to tolerate letrozole well without significant side effects. She currently feels well and is asymptomatic. She has no neurologic complaints. She denies any recent fevers or illnesses. She has a good appetite and denies weight loss.  She denies any chest pain, shortness of breath, cough, or hemoptysis.  She denies any nausea, vomiting, constipation, or diarrhea. She has no urinary complaints. Patient feels at her baseline offers no specific complaints today.  REVIEW OF SYSTEMS:   Review of Systems  Constitutional: Negative.  Negative for fever, malaise/fatigue and weight loss.  Respiratory: Negative.  Negative for cough and shortness of breath.   Cardiovascular: Negative.  Negative for chest pain and leg swelling.  Gastrointestinal: Negative.  Negative for abdominal pain.  Genitourinary: Negative.  Negative for dysuria.  Musculoskeletal: Negative.  Negative for back pain.  Skin: Negative.  Negative for rash.  Neurological: Negative.  Negative for sensory change, weakness and headaches.  Psychiatric/Behavioral: Negative.  The patient is not nervous/anxious.     As per HPI. Otherwise, a complete review of systems is negative.  ONCOLOGY HISTORY: Oncology History  Malignant [place]oplasm of left female breast ([place]  06/02/2015 Initial Diagnosis   Breast cancer of lower-inner quadrant of left female breast ([place], T1 N0 M0, ER positive, PR negative, Her 2 negative   06/05/2015 Surgery    lumpectomy and SLN biopsy    - 10/20/2015 Radiation Therapy   XRT   10/20/2015 -  Anti-estrogen oral therapy   started on Letrozole     PAST MEDICAL HISTORY: Past Medical History:  Diagnosis Date  . Arthritis    RIGHT LEG  . BRCA negative 06-22-15   Dr BBary Castilla . BRCA negative 2016  . [place] of [place] ([place]   INVASIVE LOBULAR CARCINOMA, T1b,N0. ER positive, PR negative, HER-2/neu not overexpressed. Lumpectomy c Radiation  . Family history of adverse reaction to anesthesia    SISTER-TROUBLE WAKING UP  . Hypercholesteremia   . Melanoma ([place]   . Personal history of radiation therapy 10/20/2015   left breast ca    PAST SURGICAL HISTORY: Past Surgical History:  Procedure Laterality Date  . ABDOMINAL HYSTERECTOMY  1990  [place] . BREAST BIOPSY Left 06/16/2015   fibroadenoma and ADH 11:00 position  . BREAST BIOPSY Left 06/16/2015   INVASIVE LOBULAR CARCINOMA and invasive lobular insitu at 7:30 position  . BREAST LUMPECTOMY Left 07/12/2015   clear margins for removal of ADH and invasive lobular carcinoma. Lymph nodes neagive  . BREAST LUMPECTOMY WITH SENTINEL LYMPH NODE BIOPSY Left 07/12/2015   Procedure: BREAST WIDE EXCISION WITH SENTINEL LYMPH NODE BX, MASTOPLASTY ;  Surgeon: JRobert Bellow MD;  Location: [facility] ORS;  Service: General;  Laterality: Left;  . BREAST SURGERY Left 07/13/2015    Wide excision, mastoplasty, sentinel node biopsy.  .Lillard AnesBilateral   . COLONOSCOPY  2012   [facility]  . LEG SKIN LESION  BIOPSY / EXCISION Left 2012,2015   melanoma    FAMILY HISTORY Family History  Problem Relation Age of Onset  . Breast cancer Sister 77       2016  . Breast cancer Paternal Aunt 110  . Hypertension Mother   . Diabetes Father   . Cancer Maternal Aunt 63       ovarian    GYNECOLOGIC HISTORY:  No LMP recorded. Patient has had a hysterectomy.     ADVANCED DIRECTIVES:    HEALTH MAINTENANCE: Social History     Tobacco Use  . Smoking status: Never Smoker  . Smokeless tobacco: Never Used  Vaping Use  . Vaping Use: Never used  Substance Use Topics  . Alcohol use: Yes    Alcohol/week: 0.0 standard drinks    Comment: OCC  . Drug use: No     Colonoscopy:  Bone density:  Mammogram:July 2017  Allergies  Allergen Reactions  . Duloxetine Nausea Only    Dizziness, blurred vision    Current Outpatient Medications  Medication Sig Dispense Refill  . Calcium-Magnesium-Vitamin D (CALCIUM 500 PO) Take 2 tablets by mouth daily.    Marland Kitchen letrozole (FEMARA) 2.5 MG tablet TAKE 1 TABLET EVERY DAY 90 tablet 1  . traZODone (DESYREL) 50 MG tablet Take 50 mg by mouth at bedtime.    . Vitamin D, Ergocalciferol, (DRISDOL) 50000 UNITS CAPS capsule Take 50,000 Units by mouth every 7 (seven) days.     . vitamin E 400 UNIT capsule Take 400 Units by mouth 2 (two) times daily.    Marland Kitchen ibuprofen (ADVIL,MOTRIN) 200 MG tablet Take 200 mg by mouth every 6 (six) hours as needed.     No current facility-administered medications for this visit.    OBJECTIVE: BP (!) 118/53 (BP Location: Right Arm, Patient Position: Sitting)   Pulse (!) 59   Temp 98.6 F (37 C) (Tympanic)   Wt 115 lb 3.2 oz (52.3 kg)   SpO2 100%   BMI 23.27 kg/m    Body mass index is 23.27 kg/m.    ECOG FS:0 - Asymptomatic  General: Well-developed, well-nourished, no acute distress. Eyes: Pink conjunctiva, anicteric sclera. HEENT: Normocephalic, moist mucous membranes. Breast: Exam deferred today. Lungs: No audible wheezing or coughing. Heart: Regular rate and rhythm. Abdomen: Soft, nontender, no obvious distention. Musculoskeletal: No edema, cyanosis, or clubbing. Neuro: Alert, answering all questions appropriately. Cranial nerves grossly intact. Skin: No rashes or petechiae noted. Psych: Normal affect.  LAB RESULTS:  No visits with results within 3 Day(s) from this visit.  Latest known visit with results is:  Admission on 12/30/2018,  Discharged on 12/30/2018  Component Date Value Ref Range Status  . Sodium 12/30/2018 140  135 - 145 mmol/L Final  . Potassium 12/30/2018 3.8  3.5 - 5.1 mmol/L Final  . Chloride 12/30/2018 106  98 - 111 mmol/L Final  . CO2 12/30/2018 27  22 - 32 mmol/L Final  . Glucose, Bld 12/30/2018 95  70 - 99 mg/dL Final  . BUN 12/30/2018 18  8 - 23 mg/dL Final  . Creatinine, Ser 12/30/2018 0.73  0.44 - 1.00 mg/dL Final  . Calcium 12/30/2018 8.8* 8.9 - 10.3 mg/dL Final  . GFR calc non Af Amer 12/30/2018 >60  >60 mL/min Final  . GFR calc Af Amer 12/30/2018 >60  >60 mL/min Final  . Anion gap 12/30/2018 7  5 - 15 Final   Performed at [facility], [address] [place], [place] [address]  . WBC 12/30/2018 7.5  4.0 - 10.5 K/uL Final  . RBC 12/30/2018 4.21  3.87 - 5.11  MIL/uL Final  . Hemoglobin 12/30/2018 12.1  12.0 - 15.0 g/dL Final  . HCT 12/30/2018 36.8  36 - 46 % Final  . MCV 12/30/2018 87.4  80.0 - 100.0 fL Final  . MCH 12/30/2018 28.7  26.0 - 34.0 pg Final  . MCHC 12/30/2018 32.9  30.0 - 36.0 g/dL Final  . RDW 12/30/2018 13.3  11.5 - 15.5 % Final  . Platelets 12/30/2018 266  150 - 400 K/uL Final  . nRBC 12/30/2018 0.0  0.0 - 0.2 % Final   Performed at [facility], [address] [place], [place] [address]  . Troponin I 12/30/2018 <0.03  <0.03 ng/mL Final   Performed at [facility], [address] [place], [place] [address]  . Troponin I 12/30/2018 <0.03  <0.03 ng/mL Final   Performed at [facility], [address] [place], [place] [address]  . Fibrin derivatives D-dimer ([facility]) 12/30/2018 364.94  0.00 - 499.00 ng/mL (FEU) Final   Comment: (NOTE) <> Exclusion of Venous Thromboembolism (VTE) - OUTPATIENT ONLY   (Emergency Department or [place])   0-499 ng/ml (FEU): With a low to intermediate pretest probability                      for VTE this test result excludes the diagnosis                      of VTE.   >499 ng/ml (FEU) : VTE not excluded;  additional work up for VTE is                      required. <> Testing on Inpatients and Evaluation of Disseminated Intravascular   Coagulation (DIC) Reference Range:   0-499 ng/ml (FEU) Performed at [facility] [address] [place], [place] [address]     STUDIES: No results found.  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   PLAN:    1. 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 She completed adjuvant XRT in November 2016.  Given the low stage of disease, she did not require chemotherapy. Patient has now completed 5 years of letrozole and has been instructed to discontinue treatment. Her most recent mammogram on July 25, 2020 was reported as BI-RADS 1. Repeat in August 2022. Return to clinic in 1 year for routine evaluation.  2. Osteopenia: Patient's most recent bone mineral density on September 13, 2019 reported T score of -2.1.  This is slightly worse than 1 year prior to the T score was reported -1.8.  Continue calcium and vitamin D. Repeat bone mineral density in the next 1 to 2 weeks. If her T score continues to climb, may consider initiating Fosamax at that point.    Patient expressed understanding and was in agreement with this plan. She also understands that She can call clinic at any time with any questions, concerns, or complaints.   Breast cancer of lower-inner quadrant of left female breast [facility])   Staging form: Breast, AJCC 7th Edition     Clinical stage from 06/02/2015: Stage IA (T1, N0, M0) - Signed by Evlyn Kanner, NP on 04/18/2016   Lloyd Huger, MD   11/04/2020 7:34 AM

## 2020-11-02 ENCOUNTER — Inpatient Hospital Stay: Payer: Medicare Other | Attending: Oncology | Admitting: Oncology

## 2020-11-02 ENCOUNTER — Encounter: Payer: Self-pay | Admitting: Oncology

## 2020-11-02 ENCOUNTER — Other Ambulatory Visit: Payer: Self-pay

## 2020-11-02 VITALS — BP 118/53 | HR 59 | Temp 98.6°F | Wt 115.2 lb

## 2020-11-02 DIAGNOSIS — Z79899 Other long term (current) drug therapy: Secondary | ICD-10-CM | POA: Diagnosis not present

## 2020-11-02 DIAGNOSIS — Z170 Estrogen receptor positive status [ER+]: Secondary | ICD-10-CM | POA: Insufficient documentation

## 2020-11-02 DIAGNOSIS — C50312 Malignant neoplasm of lower-inner quadrant of left female breast: Secondary | ICD-10-CM | POA: Diagnosis not present

## 2020-11-02 DIAGNOSIS — C50012 Malignant neoplasm of nipple and areola, left female breast: Secondary | ICD-10-CM | POA: Diagnosis not present

## 2020-11-02 DIAGNOSIS — M8580 Other specified disorders of bone density and structure, unspecified site: Secondary | ICD-10-CM | POA: Insufficient documentation

## 2020-11-03 DIAGNOSIS — M48062 Spinal stenosis, lumbar region with neurogenic claudication: Secondary | ICD-10-CM | POA: Insufficient documentation

## 2020-11-08 ENCOUNTER — Other Ambulatory Visit: Payer: Self-pay

## 2020-11-08 ENCOUNTER — Ambulatory Visit
Admission: RE | Admit: 2020-11-08 | Discharge: 2020-11-08 | Disposition: A | Discharge status: Home / Self Care | Payer: Medicare Other | Source: Ambulatory Visit | Attending: Oncology | Admitting: Oncology

## 2020-11-08 DIAGNOSIS — C50012 Malignant neoplasm of nipple and areola, left female breast: Secondary | ICD-10-CM | POA: Diagnosis not present

## 2020-11-15 DIAGNOSIS — Z853 Personal history of malignant neoplasm of breast: Secondary | ICD-10-CM | POA: Insufficient documentation

## 2021-05-16 ENCOUNTER — Other Ambulatory Visit: Payer: Self-pay | Admitting: Family Medicine

## 2021-05-16 DIAGNOSIS — R194 Change in bowel habit: Secondary | ICD-10-CM

## 2021-05-16 DIAGNOSIS — K5900 Constipation, unspecified: Secondary | ICD-10-CM

## 2021-06-05 ENCOUNTER — Other Ambulatory Visit: Payer: Self-pay

## 2021-06-05 ENCOUNTER — Ambulatory Visit
Admission: RE | Admit: 2021-06-05 | Discharge: 2021-06-05 | Disposition: A | Discharge status: Home / Self Care | Payer: Medicare Other | Source: Ambulatory Visit | Attending: Family Medicine | Admitting: Family Medicine

## 2021-06-05 DIAGNOSIS — K5900 Constipation, unspecified: Secondary | ICD-10-CM | POA: Diagnosis not present

## 2021-06-05 DIAGNOSIS — R194 Change in bowel habit: Secondary | ICD-10-CM | POA: Insufficient documentation

## 2021-06-05 MED ORDER — IOHEXOL 300 MG/ML  SOLN
85.0000 mL | Freq: Once | INTRAMUSCULAR | Status: AC | PRN
  Administered 2021-06-05: 85 mL via INTRAVENOUS

## 2021-06-14 ENCOUNTER — Other Ambulatory Visit: Payer: Self-pay | Admitting: Internal Medicine

## 2021-06-14 DIAGNOSIS — Z1231 Encounter for screening mammogram for malignant neoplasm of breast: Secondary | ICD-10-CM

## 2021-07-18 ENCOUNTER — Other Ambulatory Visit: Payer: Self-pay | Admitting: Physical Medicine & Rehabilitation

## 2021-07-18 DIAGNOSIS — G8929 Other chronic pain: Secondary | ICD-10-CM

## 2021-07-31 ENCOUNTER — Other Ambulatory Visit: Payer: Self-pay

## 2021-07-31 ENCOUNTER — Ambulatory Visit
Admission: RE | Admit: 2021-07-31 | Discharge: 2021-07-31 | Disposition: A | Discharge status: Home / Self Care | Payer: Medicare Other | Source: Ambulatory Visit | Attending: Physical Medicine & Rehabilitation | Admitting: Physical Medicine & Rehabilitation

## 2021-07-31 DIAGNOSIS — G8929 Other chronic pain: Secondary | ICD-10-CM | POA: Diagnosis present

## 2021-07-31 DIAGNOSIS — M5442 Lumbago with sciatica, left side: Secondary | ICD-10-CM | POA: Insufficient documentation

## 2021-08-14 ENCOUNTER — Ambulatory Visit
Admission: RE | Admit: 2021-08-14 | Discharge: 2021-08-14 | Disposition: A | Discharge status: Home / Self Care | Payer: Medicare Other | Source: Ambulatory Visit | Attending: Internal Medicine | Admitting: Internal Medicine

## 2021-08-14 ENCOUNTER — Other Ambulatory Visit: Payer: Self-pay

## 2021-08-14 DIAGNOSIS — Z1231 Encounter for screening mammogram for malignant neoplasm of breast: Secondary | ICD-10-CM

## 2021-10-29 NOTE — Progress Notes (Signed)
[facility]  [phone]  [phone]     SIMREN POPSON DOB: June 30, 1956  MR#: [id number]  DJS#:[id number]  Patient Care Team: [name], MD as PCP - General (Internal Medicine) Doy Hutching Leonie Douglas, MD (Internal Medicine) Bary Castilla Forest Gleason, MD (General Surgery)  I connected with Rozelle Logan on 11/02/21 at  2:45 PM EST by video enabled telemedicine visit and verified that I am speaking with the correct person using two identifiers.   I discussed the limitations, risks, security and privacy concerns of performing an evaluation and management service by telemedicine and the availability of in-person appointments. I also discussed with the patient that there may be a patient responsible charge related to this service. The patient expressed understanding and agreed to proceed.   Other persons participating in the visit and their role in the encounter: Patient, MD.  Patient's location: Home. Provider's location: Clinic.  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  INTERVAL HISTORY: Patient agreed to video assisted telemedicine visit for her routine yearly evaluation.  She currently feels well and is asymptomatic.  She discontinued letrozole approximately 1 year ago. She has no neurologic complaints. She denies any recent fevers or illnesses. She has a good appetite and denies weight loss.  She denies any chest pain, shortness of breath, cough, or hemoptysis.  She denies any nausea, vomiting, constipation, or diarrhea. She has no urinary complaints.  Patient offers no specific complaints today.  REVIEW OF SYSTEMS:   Review of Systems  Constitutional: Negative.  Negative for fever, malaise/fatigue and weight loss.  Respiratory: Negative.  Negative for cough and shortness of breath.   Cardiovascular: Negative.  Negative for chest pain and leg swelling.  Gastrointestinal:  Negative.  Negative for abdominal pain.  Genitourinary: Negative.  Negative for dysuria.  Musculoskeletal: Negative.  Negative for back pain.  Skin: Negative.  Negative for rash.  Neurological: Negative.  Negative for sensory change, weakness and headaches.  Psychiatric/Behavioral: Negative.  The patient is not nervous/anxious.    As per HPI. Otherwise, a complete review of systems is negative.  ONCOLOGY HISTORY: Oncology History  Malignant neoplasm of left female breast ([place])  06/02/2015 Initial Diagnosis   Breast cancer of lower-inner quadrant of left female breast ([place]), T1 N0 M0, ER positive, PR negative, Her 2 negative   06/05/2015 Surgery   lumpectomy and SLN biopsy    - 10/20/2015 Radiation Therapy   XRT   10/20/2015 -  Anti-estrogen oral therapy   started on Letrozole     PAST MEDICAL HISTORY: Past Medical History:  Diagnosis Date   Arthritis    RIGHT LEG   BRCA negative 06-22-15   Dr Bary Castilla   BRCA negative 2016   Cancer of breast ([place]) 2016-Left   INVASIVE LOBULAR CARCINOMA, T1b,N0. ER positive, PR negative, HER-2/neu not overexpressed. Lumpectomy c Radiation   Family history of adverse reaction to anesthesia    SISTER-TROUBLE WAKING UP   Hypercholesteremia    Melanoma ([place])    Personal history of radiation therapy 10/20/2015   left breast ca    PAST SURGICAL HISTORY: Past Surgical History:  Procedure Laterality Date   ABDOMINAL HYSTERECTOMY  1990   BACK SURGERY     LUMBAR-HERNIATED [place]   BREAST BIOPSY Left 06/16/2015   fibroadenoma and ADH 11:00 position   BREAST BIOPSY Left 06/16/2015   INVASIVE LOBULAR CARCINOMA and invasive lobular insitu at 7:30 position   BREAST LUMPECTOMY Left  07/12/2015   clear margins for removal of ADH and invasive lobular carcinoma. Lymph nodes neagive   BREAST LUMPECTOMY WITH SENTINEL LYMPH NODE BIOPSY Left 07/12/2015   Procedure: BREAST WIDE EXCISION WITH SENTINEL LYMPH NODE BX, MASTOPLASTY ;  Surgeon: [name], MD;   Location: [facility] ORS;  Service: General;  Laterality: Left;   BREAST SURGERY Left 07/13/2015    Wide excision, mastoplasty, sentinel node biopsy.   BUNIONECTOMY Bilateral    COLONOSCOPY  2012   [facility]   LEG SKIN LESION  BIOPSY / EXCISION Left 2012,2015   melanoma    FAMILY HISTORY Family History  Problem Relation Age of Onset   Breast cancer Sister 51       2016   Breast cancer Paternal Aunt 76   Hypertension Mother    Diabetes Father    Cancer Maternal Aunt 63       ovarian    GYNECOLOGIC HISTORY:  No LMP recorded. Patient has had a hysterectomy.     ADVANCED DIRECTIVES:    HEALTH MAINTENANCE: Social History   Tobacco Use   Smoking status: Never   Smokeless tobacco: Never  Vaping Use   Vaping Use: Never used  Substance Use Topics   Alcohol use: Yes    Alcohol/week: 0.0 standard drinks    Comment: OCC   Drug use: No     Colonoscopy:  Bone density:  Mammogram:July 2017  Allergies  Allergen Reactions   Duloxetine Nausea Only    Dizziness, blurred vision    Current Outpatient Medications  Medication Sig Dispense Refill   Calcium-Magnesium-Vitamin D (CALCIUM 500 PO) Take 2 tablets by mouth daily.     dicyclomine (BENTYL) 20 MG tablet Take by mouth.     docusate sodium (COLACE) 50 MG capsule Take by mouth.     gabapentin (NEURONTIN) 300 MG capsule 1 po tid     ibuprofen (ADVIL,MOTRIN) 200 MG tablet Take 200 mg by mouth every 6 (six) hours as needed.     traZODone (DESYREL) 50 MG tablet Take 50 mg by mouth at bedtime.     Vitamin D, Ergocalciferol, (DRISDOL) 50000 UNITS CAPS capsule Take 50,000 Units by mouth every 7 (seven) days.      vitamin E 400 UNIT capsule Take 400 Units by mouth 2 (two) times daily.     letrozole (FEMARA) 2.5 MG tablet TAKE 1 TABLET EVERY DAY (Patient not taking: Reported on 11/01/2021) 90 tablet 1   No current facility-administered medications for this visit.    OBJECTIVE: There were no vitals taken for this visit.   There is no  height or weight on file to calculate BMI.    ECOG FS:0 - Asymptomatic  General: Well-developed, well-nourished, no acute distress. HEENT: Normocephalic. Neuro: Alert, answering all questions appropriately. Cranial nerves grossly intact. Psych: Normal affect.   LAB RESULTS:  No visits with results within 3 Day(s) from this visit.  Latest known visit with results is:  Admission on 12/30/2018, Discharged on 12/30/2018  Component Date Value Ref Range Status   Sodium 12/30/2018 140  135 - 145 mmol/L Final   Potassium 12/30/2018 3.8  3.5 - 5.1 mmol/L Final   Chloride 12/30/2018 106  98 - 111 mmol/L Final   CO2 12/30/2018 27  22 - 32 mmol/L Final   Glucose, Bld 12/30/2018 95  70 - 99 mg/dL Final   BUN 12/30/2018 18  8 - 23 mg/dL Final   Creatinine, Ser 12/30/2018 0.73  0.44 - 1.00 mg/dL Final   Calcium  12/30/2018 8.8 (L)  8.9 - 10.3 mg/dL Final   GFR calc non Af Amer 12/30/2018 >60  >60 mL/min Final   GFR calc Af Amer 12/30/2018 >60  >60 mL/min Final   Anion gap 12/30/2018 7  5 - 15 Final   Performed at [facility], [place], [place] [address]   WBC 12/30/2018 7.5  4.0 - 10.5 K/uL Final   RBC 12/30/2018 4.21  3.87 - 5.11 MIL/uL Final   Hemoglobin 12/30/2018 12.1  12.0 - 15.0 g/dL Final   HCT 12/30/2018 36.8  36.0 - 46.0 % Final   MCV 12/30/2018 87.4  80.0 - 100.0 fL Final   MCH 12/30/2018 28.7  26.0 - 34.0 pg Final   MCHC 12/30/2018 32.9  30.0 - 36.0 g/dL Final   RDW 12/30/2018 13.3  11.5 - 15.5 % Final   Platelets 12/30/2018 266  150 - 400 K/uL Final   nRBC 12/30/2018 0.0  0.0 - 0.2 % Final   Performed at [facility], [address] [place], [place] [address]   Troponin I 12/30/2018 <0.03  <0.03 ng/mL Final   Performed at [facility], [address] [place], [place] [address]   Troponin I 12/30/2018 <0.03  <0.03 ng/mL Final   Performed at [facility], [place], [place] [address]   Fibrin derivatives  D-dimer [facility]) 12/30/2018 364.94  0.00 - 499.00 ng/mL (FEU) Final   Comment: (NOTE) <> Exclusion of Venous Thromboembolism (VTE) - OUTPATIENT ONLY   (Emergency Department or [place])   0-499 ng/ml (FEU): With a low to intermediate pretest probability                      for VTE this test result excludes the diagnosis                      of VTE.   >499 ng/ml (FEU) : VTE not excluded; additional work up for VTE is                      required. <> Testing on Inpatients and Evaluation of Disseminated Intravascular   Coagulation (DIC) Reference Range:   0-499 ng/ml (FEU) Performed at [facility], [address] [place], [place] [address]     STUDIES: No results found.  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   PLAN:    1. 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 She completed adjuvant XRT in November 2016.  Given the low stage of disease, she did not require chemotherapy.  Patient completed 5 years of adjuvant letrozole in November 2021.  Patient's most recent mammogram on September 20 August 14, 2021 was reported as BI-RADS 1.  Repeat in September 2023.  Return to clinic in 1 year with video assisted telemedicine visit for routine evaluation. 2. Osteopenia: Patient's most recent bone mineral density on November 08, 2020 reported T score of -2.0 which is essentially unchanged from 1 year prior where her T score was reported -2.1.  Continue calcium and vitamin D supplementation.  Will defer further bone mineral densities to patient's primary care physician which she will see in January 2023.  I provided 20 minutes of face-to-face video visit time during this encounter which included chart review, counseling, and  coordination of care as documented above.  Patient  expressed understanding and was in agreement with this plan. She also understands that She can call clinic at any time with any questions, concerns, or complaints.   Breast cancer of lower-inner quadrant of left female breast [facility])   Staging form: Breast, AJCC 7th Edition     Clinical stage from 06/02/2015: Stage IA (T1, N0, M0) - Signed by Evlyn Kanner, NP on 04/18/2016   Lloyd Huger, MD   11/02/2021 11:02 AM

## 2021-10-31 ENCOUNTER — Telehealth: Payer: Self-pay | Admitting: Oncology

## 2021-10-31 NOTE — Telephone Encounter (Signed)
Pt called to cancel her appt due to her husband tested pos. For covid. Please call to reschedule at [phone]

## 2021-11-01 ENCOUNTER — Encounter: Payer: Self-pay | Admitting: Oncology

## 2021-11-01 ENCOUNTER — Inpatient Hospital Stay: Payer: Medicare Other | Attending: Oncology | Admitting: Oncology

## 2021-11-01 DIAGNOSIS — C50012 Malignant neoplasm of nipple and areola, left female breast: Secondary | ICD-10-CM | POA: Diagnosis not present

## 2021-11-01 DIAGNOSIS — Z170 Estrogen receptor positive status [ER+]: Secondary | ICD-10-CM | POA: Diagnosis not present

## 2021-12-02 ENCOUNTER — Ambulatory Visit
Admission: RE | Admit: 2021-12-02 | Discharge: 2021-12-02 | Disposition: A | Discharge status: Home / Self Care | Payer: Medicare Other | Source: Ambulatory Visit | Attending: Emergency Medicine | Admitting: Emergency Medicine

## 2021-12-02 ENCOUNTER — Other Ambulatory Visit: Payer: Self-pay

## 2021-12-02 VITALS — BP 112/53 | HR 85 | Temp 98.7°F | Resp 18 | Ht 60.0 in | Wt 118.0 lb

## 2021-12-02 DIAGNOSIS — R051 Acute cough: Secondary | ICD-10-CM

## 2021-12-02 DIAGNOSIS — J069 Acute upper respiratory infection, unspecified: Secondary | ICD-10-CM

## 2021-12-02 MED ORDER — BENZONATATE 100 MG PO CAPS: 200.0000 mg | ORAL_CAPSULE | Freq: Three times a day (TID) | ORAL | 0 refills | Status: DC

## 2021-12-02 MED ORDER — PROMETHAZINE-DM 6.25-15 MG/5ML PO SYRP: 5.0000 mL | ORAL_SOLUTION | Freq: Four times a day (QID) | ORAL | 0 refills | Status: DC | PRN

## 2021-12-02 MED ORDER — AMOXICILLIN-POT CLAVULANATE 875-125 MG PO TABS: 1.0000 | ORAL_TABLET | Freq: Two times a day (BID) | ORAL | 0 refills | Status: DC

## 2021-12-02 MED ORDER — IPRATROPIUM BROMIDE 0.06 % NA SOLN: 2.0000 | Freq: Four times a day (QID) | NASAL | 12 refills | Status: AC

## 2021-12-02 NOTE — Discharge Instructions (Signed)
Take the Augmentin twice daily for 7 days for treatment of your URI.   Use the Atrovent nasal spray, 2 squirts in each nostril every 6 hours, as needed for runny nose and postnasal drip.  Use the Tessalon Perles every 8 hours during the day.  Take them with a small sip of water.  They may give you some numbness to the base of your tongue or a metallic taste in your mouth, this is normal.  Use the Promethazine DM cough syrup at bedtime for cough and congestion.  It will make you drowsy so do not take it during the day.  Return for reevaluation or see your primary care provider for any new or worsening symptoms.

## 2021-12-02 NOTE — ED Triage Notes (Signed)
Pt here with C/O  chest congestion since Tuesday, had Covid and Flu test Wednesday, was negative. Started  coughing up green/yellow mucus.

## 2021-12-02 NOTE — ED Provider Notes (Signed)
[facility]    CSN: [id number] Arrival date & time: 12/02/21  1142      History   Chief Complaint Chief Complaint  Patient presents with   Cough    12pm Appointment    HPI Lauren Mayo is a 66 y.o. female.   HPI  66 year old female here for evaluation ristra symptoms.  Patient reports that she has been experiencing chest congestion for the last 5 days.  4 days ago she was tested for COVID and flu and it was negative.  Now she is starting to produce greenish mucus when she coughs.  She also endorses chills, runny nose, nasal congestion, ear pain, sore throat, and headache.  She has not had a fever, shortness breath or wheezing, GI complaints, or body aches.  Past Medical History:  Diagnosis Date   Arthritis    RIGHT LEG   BRCA negative 06-22-15   Dr Bary Castilla   BRCA negative 2016   Cancer of breast ([place]) 2016-Left   INVASIVE LOBULAR CARCINOMA, T1b,N0. ER positive, PR negative, HER-2/neu not overexpressed. Lumpectomy c Radiation   Family history of adverse reaction to anesthesia    SISTER-TROUBLE WAKING UP   Hypercholesteremia    Melanoma ([place])    Personal history of radiation therapy 10/20/2015   left breast ca    Patient Active Problem List   Diagnosis Date Noted   Personal history of breast cancer 11/15/2020   Lumbar stenosis with neurogenic claudication 11/03/2020   Chronic bilateral low back pain with left-sided sciatica 03/16/2020   HTN, goal below 140/80 11/11/2018   DDD (degenerative disc disease), lumbar 01/08/2017   Vitamin D deficiency disease 04/16/2016   Malignant neoplasm of left female breast ([place]) 01/22/2016   Atypical lobular hyperplasia of left breast 06/20/2015   HLD (hyperlipidemia) 07/06/2014   Cyst of ovary 07/06/2014   Fever 04/14/2014    Past Surgical History:  Procedure Laterality Date   ABDOMINAL HYSTERECTOMY  1990   BACK SURGERY     LUMBAR-HERNIATED [place]   BREAST BIOPSY Left 06/16/2015   fibroadenoma and ADH 11:00  position   BREAST BIOPSY Left 06/16/2015   INVASIVE LOBULAR CARCINOMA and invasive lobular insitu at 7:30 position   BREAST LUMPECTOMY Left 07/12/2015   clear margins for removal of ADH and invasive lobular carcinoma. Lymph nodes neagive   BREAST LUMPECTOMY WITH SENTINEL LYMPH NODE BIOPSY Left 07/12/2015   Procedure: BREAST WIDE EXCISION WITH SENTINEL LYMPH NODE BX, MASTOPLASTY ;  Surgeon: [name], MD;  Location: [facility] ORS;  Service: General;  Laterality: Left;   BREAST SURGERY Left 07/13/2015    Wide excision, mastoplasty, sentinel node biopsy.   BUNIONECTOMY Bilateral    COLONOSCOPY  2012   [facility]   LEG SKIN LESION  BIOPSY / EXCISION Left 2012,2015   melanoma    OB History     Gravida  1   Para      Term      Preterm      AB      Living  1      SAB      IAB      Ectopic      Multiple      Live Births           Obstetric Comments  1st Menstrual Cycle:  11 1st Pregnancy:  31          Home Medications    Prior to Admission medications   Medication Sig Start Date End Date  Taking? Authorizing Provider  amoxicillin-clavulanate (AUGMENTIN) 875-125 MG tablet Take 1 tablet by mouth every 12 (twelve) hours. 12/02/21  Yes Margarette Canada, NP  benzonatate (TESSALON) 100 MG capsule Take 2 capsules (200 mg total) by mouth every 8 (eight) hours. 12/02/21  Yes Margarette Canada, NP  Calcium-Magnesium-Vitamin D (CALCIUM 500 PO) Take 2 tablets by mouth daily.   Yes [provider]  dicyclomine (BENTYL) 20 MG tablet Take by mouth. 09/04/21 09/04/22 Yes [provider]  docusate sodium (COLACE) 50 MG capsule Take by mouth.   Yes [provider]  gabapentin (NEURONTIN) 300 MG capsule 1 po tid 10/16/21  Yes [provider]  ibuprofen (ADVIL,MOTRIN) 200 MG tablet Take 200 mg by mouth every 6 (six) hours as needed.   Yes [provider]  ipratropium (ATROVENT) 0.06 % nasal spray Place 2 sprays into both nostrils 4 (four) times daily.  12/02/21  Yes Margarette Canada, NP  letrozole [facility]) 2.5 MG tablet TAKE 1 TABLET EVERY DAY 10/02/20  Yes Lloyd Huger, MD  promethazine-dextromethorphan (PROMETHAZINE-DM) 6.25-15 MG/5ML syrup Take 5 mLs by mouth 4 (four) times daily as needed. 12/02/21  Yes Margarette Canada, NP  traZODone (DESYREL) 50 MG tablet Take 50 mg by mouth at bedtime.   Yes [provider]  Vitamin D, Ergocalciferol, (DRISDOL) 50000 UNITS CAPS capsule Take 50,000 Units by mouth every 7 (seven) days.  04/12/15  Yes [provider]  vitamin E 400 UNIT capsule Take 400 Units by mouth 2 (two) times daily.   Yes [provider]    Family History Family History  Problem Relation Age of Onset   Breast cancer Sister 62       2016   Breast cancer Paternal Aunt 38   Hypertension Mother    Diabetes Father    Cancer Maternal Aunt 63       ovarian    Social History Social History   Tobacco Use   Smoking status: Never   Smokeless tobacco: Never  Vaping Use   Vaping Use: Never used  Substance Use Topics   Alcohol use: Yes    Alcohol/week: 0.0 standard drinks    Comment: OCC   Drug use: No     Allergies   Duloxetine   Review of Systems Review of Systems  Constitutional:  Positive for chills. Negative for activity change, appetite change and fever.  HENT:  Positive for congestion, ear pain, rhinorrhea and sore throat.   Respiratory:  Positive for cough. Negative for shortness of breath and wheezing.   Gastrointestinal:  Negative for diarrhea, nausea and vomiting.  Musculoskeletal:  Negative for arthralgias and myalgias.  Skin:  Negative for rash.  Neurological:  Positive for headaches.  Hematological: Negative.   Psychiatric/Behavioral: Negative.      Physical Exam Triage Vital Signs ED Triage Vitals  Enc Vitals Group     BP 12/02/21 1206 (!) 112/53     Pulse Rate 12/02/21 1206 85     Resp 12/02/21 1206 18     Temp 12/02/21 1206 98.7 F (37.1 C)     Temp Source 12/02/21 1206  Oral     SpO2 12/02/21 1206 99 %     Weight 12/02/21 1205 118 lb (53.5 kg)     Height 12/02/21 1205 5' (1.524 m)     Head Circumference --      Peak Flow --      Pain Score 12/02/21 1204 0     Pain Loc --  Pain Edu? --      Excl. in [place]? --    No data found.  Updated Vital Signs BP (!) 112/53 (BP Location: Right Arm)    Pulse 85    Temp 98.7 F (37.1 C) (Oral)    Resp 18    Ht 5' (1.524 m)    Wt 118 lb (53.5 kg)    SpO2 99%    BMI 23.05 kg/m   Visual Acuity Right Eye Distance:   Left Eye Distance:   Bilateral Distance:    Right Eye Near:   Left Eye Near:    Bilateral Near:     Physical Exam Vitals and nursing note reviewed.  Constitutional:      General: She is not in acute distress.    Appearance: Normal appearance. She is not ill-appearing.  HENT:     Head: Normocephalic and atraumatic.     Right Ear: Tympanic membrane, ear canal and external ear normal. There is no impacted cerumen.     Left Ear: Tympanic membrane, ear canal and external ear normal. There is no impacted cerumen.     Nose: Congestion and rhinorrhea present.     Comments: Patient has marked edema bilateral nasal mucosa.  I am unable to see her turbinates are visualized her maxillary sinus outlets.    Mouth/Throat:     Mouth: Mucous membranes are moist.     Pharynx: Oropharynx is clear. Posterior oropharyngeal erythema present.     Comments: Posterior oropharyngeal erythema and injection noted.  There is green postnasal drip as well. Cardiovascular:     Rate and Rhythm: Normal rate and regular rhythm.     Pulses: Normal pulses.     Heart sounds: Normal heart sounds. No murmur heard.   No friction rub. No gallop.  Pulmonary:     Effort: Pulmonary effort is normal.     Breath sounds: Normal breath sounds. No wheezing, rhonchi or rales.  Musculoskeletal:     Cervical back: Normal range of motion and neck supple.  Lymphadenopathy:     Cervical: No cervical adenopathy.  Skin:    General: Skin is  warm and dry.     Capillary Refill: Capillary refill takes less than 2 seconds.     Findings: No erythema or rash.  Neurological:     General: No focal deficit present.     Mental Status: She is alert and oriented to person, place, and time.  Psychiatric:        Mood and Affect: Mood normal.        Behavior: Behavior normal.        Thought Content: Thought content normal.        Judgment: Judgment normal.     UC Treatments / Results  Labs (all labs ordered are listed, but only abnormal results are displayed) Labs Reviewed - No data to display  EKG   Radiology No results found.  Procedures Procedures (including critical care time)  Medications Ordered in UC Medications - No data to display  Initial Impression / Assessment and Plan / UC Course  I have reviewed the triage vital signs and the nursing notes.  Pertinent labs & imaging results that were available during my care of the patient were reviewed by me and considered in my medical decision making (see chart for details).  Patient is a pleasant, nontoxic-appearing 35 old female here for evaluation of ongoing cough that is now producing colored mucus and has been present for last 5 days.  This is in association with chills, runny nose, nasal congestion, ear pain, sore throat, and headache.  On exam patient has pearly-gray tympanic membranes bilaterally with normal light reflex and clear external auditory canals.  Her nasal mucosa is markedly edematous and erythematous.  No tenderness to percussion of frontal or maxillary sinuses.  Oropharyngeal exam reveals posterior oropharyngeal erythema and injection with green postnasal drip.  No cervical lymphadenopathy appreciated exam.  Cardiopulmonary exam shows clear lung sounds in all fields.  Patient Damas consistent with upper strain infection and a cough.  We will treat her with a trial of Augmentin twice daily for 7 days for her URI given the purulence of the postnasal drip.  We  will also give prescription of Atrovent nasal spray, Tessalon Perles, and Promethazine DM cough syrup for cough and congestion.   Final Clinical Impressions(s) / UC Diagnoses   Final diagnoses:  Upper respiratory tract infection, unspecified type  Acute cough     Discharge Instructions      Take the Augmentin twice daily for 7 days for treatment of your URI.   Use the Atrovent nasal spray, 2 squirts in each nostril every 6 hours, as needed for runny nose and postnasal drip.  Use the Tessalon Perles every 8 hours during the day.  Take them with a small sip of water.  They may give you some numbness to the base of your tongue or a metallic taste in your mouth, this is normal.  Use the Promethazine DM cough syrup at bedtime for cough and congestion.  It will make you drowsy so do not take it during the day.  Return for reevaluation or see your primary care provider for any new or worsening symptoms.      ED Prescriptions     Medication Sig Dispense Auth. Provider   amoxicillin-clavulanate (AUGMENTIN) 875-125 MG tablet Take 1 tablet by mouth every 12 (twelve) hours. 14 tablet Margarette Canada, NP   benzonatate (TESSALON) 100 MG capsule Take 2 capsules (200 mg total) by mouth every 8 (eight) hours. 21 capsule Margarette Canada, NP   ipratropium (ATROVENT) 0.06 % nasal spray Place 2 sprays into both nostrils 4 (four) times daily. 15 mL Margarette Canada, NP   promethazine-dextromethorphan (PROMETHAZINE-DM) 6.25-15 MG/5ML syrup Take 5 mLs by mouth 4 (four) times daily as needed. 118 mL Margarette Canada, NP      PDMP not reviewed this encounter.   Margarette Canada, NP 12/02/21 1302

## 2022-04-10 ENCOUNTER — Ambulatory Visit: Payer: Medicare Other

## 2022-04-10 ENCOUNTER — Ambulatory Visit
Admission: EM | Admit: 2022-04-10 | Discharge: 2022-04-10 | Disposition: A | Discharge status: Home / Self Care | Payer: Medicare Other | Attending: Family | Admitting: Family

## 2022-04-10 DIAGNOSIS — R69 Illness, unspecified: Secondary | ICD-10-CM | POA: Diagnosis not present

## 2022-04-10 DIAGNOSIS — J029 Acute pharyngitis, unspecified: Secondary | ICD-10-CM | POA: Insufficient documentation

## 2022-04-10 DIAGNOSIS — J111 Influenza due to unidentified influenza virus with other respiratory manifestations: Secondary | ICD-10-CM

## 2022-04-10 DIAGNOSIS — R051 Acute cough: Secondary | ICD-10-CM | POA: Diagnosis present

## 2022-04-10 DIAGNOSIS — Z20822 Contact with and (suspected) exposure to covid-19: Secondary | ICD-10-CM | POA: Diagnosis not present

## 2022-04-10 LAB — RESP PANEL BY RT-PCR (FLU A&B, COVID) ARPGX2
Influenza A by PCR: NEGATIVE
Influenza B by PCR: NEGATIVE
SARS Coronavirus 2 by RT PCR: NEGATIVE

## 2022-04-10 LAB — GROUP A STREP BY PCR: Group A Strep by PCR: NOT DETECTED

## 2022-04-10 MED ORDER — BENZONATATE 100 MG PO CAPS
ORAL_CAPSULE | ORAL | 0 refills | Status: AC
Start: 2022-04-10 — End: ?

## 2022-04-10 NOTE — Discharge Instructions (Addendum)
Recommend start Tessalon cough pills - take 1 to 2 every 8 hours as needed. May also take OTC Delsym 2 teaspoons every 12 hours as needed for cough. Continue to push fluids. Continue Ibuprofen '600mg'$  and alternate Tylenol '1000mg'$  every 6 hours as needed for throat pain/headache/body aches. Rest. Follow-up in 3 to 4 days if not improving.  ?

## 2022-04-10 NOTE — ED Triage Notes (Signed)
Pt reports cough, headache,  sore throat and body aches x 3 days. Pt reports she felt exhausted like when she ad COVID.   Ibuprofen and acetaminophen gives relief to the sore throat.  ? ? ?

## 2022-04-10 NOTE — ED Provider Notes (Signed)
?[place] ? ? ? ?CSN: [id number] ?Arrival date & time: 04/10/22  7829 ? ? ?  ? ?History   ?Chief Complaint ?Chief Complaint  ?Patient presents with  ? Sore Throat  ? Cough  ? Generalized Body Aches  ?   ?  ? ? ?HPI ?Lauren Mayo is a 66 y.o. female.  ? ?66 year old female presents with body aches, extreme fatigue, headache for the past 3 days. Developed sore throat and cough yesterday along with some post nasal drainage. Denies any fever or GI symptoms. Patient indicates that the fatigue feels similar to when she had COVID in early 2022. Has taken Ibuprofen and alternated with Tylenol with some relief in throat pain/headache. Daughter and grandson have also been sick in the past 2 weeks but have recovered. Other chronic health issues include chronic back pain, insomnia and history of left breast cancer. Currently on Gabapentin, Trazodone and Dicyclomine daily as well as multiple supplements.  ? ?The history is provided by the patient.  ? ?Past Medical History:  ?Diagnosis Date  ? Arthritis   ? RIGHT LEG  ? BRCA negative 06-22-15  ? Dr Bary Castilla  ? BRCA negative 2016  ? Cancer of breast ([place]) 2016-Left  ? INVASIVE LOBULAR CARCINOMA, T1b,N0. ER positive, PR negative, HER-2/neu not overexpressed. Lumpectomy c Radiation  ? Family history of adverse reaction to anesthesia   ? SISTER-TROUBLE WAKING UP  ? Hypercholesteremia   ? Melanoma ([place])   ? Personal history of radiation therapy 10/20/2015  ? left breast ca  ? ? ?Patient Active Problem List  ? Diagnosis Date Noted  ? Personal history of breast cancer 11/15/2020  ? Lumbar stenosis with neurogenic claudication 11/03/2020  ? Chronic bilateral low back pain with left-sided sciatica 03/16/2020  ? HTN, goal below 140/80 11/11/2018  ? DDD (degenerative disc disease), lumbar 01/08/2017  ? Vitamin D deficiency disease 04/16/2016  ? Malignant neoplasm of left female breast ([place]) 01/22/2016  ? Atypical lobular hyperplasia of left breast 06/20/2015  ? HLD  (hyperlipidemia) 07/06/2014  ? Cyst of ovary 07/06/2014  ? Fever 04/14/2014  ? ? ?Past Surgical History:  ?Procedure Laterality Date  ? ABDOMINAL HYSTERECTOMY  1990  ? BACK SURGERY    ? LUMBAR-HERNIATED DISC  ? BREAST BIOPSY Left 06/16/2015  ? fibroadenoma and ADH 11:00 position  ? BREAST BIOPSY Left 06/16/2015  ? INVASIVE LOBULAR CARCINOMA and invasive lobular insitu at 7:30 position  ? BREAST LUMPECTOMY Left 07/12/2015  ? clear margins for removal of ADH and invasive lobular carcinoma. Lymph nodes neagive  ? BREAST LUMPECTOMY WITH SENTINEL LYMPH NODE BIOPSY Left 07/12/2015  ? Procedure: BREAST WIDE EXCISION WITH SENTINEL LYMPH NODE BX, MASTOPLASTY ;  Surgeon: [name], MD;  Location: [facility] ORS;  Service: General;  Laterality: Left;  ? BREAST SURGERY Left 07/13/2015   ? Wide excision, mastoplasty, sentinel node biopsy.  ? BUNIONECTOMY Bilateral   ? COLONOSCOPY  2012  ? [place]  ? LEG SKIN LESION  BIOPSY / EXCISION Left 2012,2015  ? melanoma  ? ? ?OB History   ? ? Gravida  ?1  ? Para  ?   ? Term  ?   ? Preterm  ?   ? AB  ?   ? Living  ?1  ?  ? ? SAB  ?   ? IAB  ?   ? Ectopic  ?   ? Multiple  ?   ? Live Births  ?   ?   ?  ?  Obstetric Comments  ?1st Menstrual Cycle:  11 ?1st Pregnancy:  31  ?  ? ?  ? ? ? ?Home Medications   ? ?Prior to Admission medications   ?Medication Sig Start Date End Date Taking? Authorizing Provider  ?benzonatate (TESSALON PERLES) 100 MG capsule Take 1 to 2 capsules by mouth every 8 hours as needed for cough 04/10/22  Yes Trista Ciocca, Nicholes Stairs, NP  ?Calcium-Magnesium-Vitamin D (CALCIUM 500 PO) Take 2 tablets by mouth daily.    [provider]  ?dicyclomine (BENTYL) 20 MG tablet Take by mouth. 09/04/21 09/04/22  [provider]  ?docusate sodium (COLACE) 50 MG capsule Take by mouth.    [provider]  ?gabapentin (NEURONTIN) 300 MG capsule 1 po tid 10/16/21   [provider]  ?ibuprofen (ADVIL,MOTRIN) 200 MG tablet Take 200 mg by mouth every 6 (six) hours as  needed.    [provider]  ?ipratropium (ATROVENT) 0.06 % nasal spray Place 2 sprays into both nostrils 4 (four) times daily. 12/02/21   Margarette Canada, NP  ?letrozole [facility]) 2.5 MG tablet TAKE 1 TABLET EVERY DAY 10/02/20   Lloyd Huger, MD  ?traZODone (DESYREL) 50 MG tablet Take 50 mg by mouth at bedtime.    [provider]  ?Vitamin D, Ergocalciferol, (DRISDOL) 50000 UNITS CAPS capsule Take 50,000 Units by mouth every 7 (seven) days.  04/12/15   [provider]  ?vitamin E 400 UNIT capsule Take 400 Units by mouth 2 (two) times daily.    [provider]  ? ? ?Family History ?Family History  ?Problem Relation Age of Onset  ? Breast cancer Sister 88  ?     2016  ? Breast cancer Paternal Aunt 19  ? Hypertension Mother   ? Diabetes Father   ? Cancer Maternal Aunt 74  ?     ovarian  ? ? ?Social History ?Social History  ? ?Tobacco Use  ? Smoking status: Never  ? Smokeless tobacco: Never  ?Vaping Use  ? Vaping Use: Never used  ?Substance Use Topics  ? Alcohol use: Yes  ?  Alcohol/week: 0.0 standard drinks  ?  Comment: OCC  ? Drug use: No  ? ? ? ?Allergies   ?Duloxetine ? ? ?Review of Systems ?Review of Systems  ?Constitutional:  Positive for activity change and fatigue. Negative for appetite change, chills, diaphoresis and fever.  ?HENT:  Positive for congestion, postnasal drip, sinus pressure and sore throat. Negative for ear discharge, ear pain, facial swelling, mouth sores, nosebleeds, sinus pain and trouble swallowing.   ?Eyes:  Negative for pain, discharge, redness and itching.  ?Respiratory:  Positive for cough. Negative for chest tightness, shortness of breath and wheezing.   ?Gastrointestinal:  Negative for diarrhea, nausea and vomiting.  ?Musculoskeletal:  Positive for arthralgias and myalgias. Negative for neck pain and neck stiffness.  ?Skin:  Negative for color change and rash.  ?Allergic/Immunologic: Negative for environmental allergies and food allergies.   ?Neurological:  Positive for headaches. Negative for dizziness, tremors, seizures, syncope, speech difficulty and numbness.  ?Hematological:  Negative for adenopathy. Does not bruise/bleed easily.  ?Psychiatric/Behavioral:  Positive for sleep disturbance.   ? ? ?Physical Exam ?Triage Vital Signs ?ED Triage Vitals  ?Enc Vitals Group  ?   BP 04/10/22 0819 (!) 121/56  ?   Pulse Rate 04/10/22 0819 86  ?   Resp 04/10/22 0819 18  ?   Temp 04/10/22 0819 98.3 ?F (36.8 ?C)  ?   Temp Source 04/10/22  4854 Oral  ?   SpO2 04/10/22 0819 100 %  ?   Weight --   ?   Height --   ?   Head Circumference --   ?   Peak Flow --   ?   Pain Score 04/10/22 0821 5  ?   Pain Loc --   ?   Pain Edu? --   ?   Excl. in [place]? --   ? ?No data found. ? ?Updated Vital Signs ?BP (!) 121/56 (BP Location: Right Arm)   Pulse 86   Temp 98.3 ?F (36.8 ?C) (Oral)   Resp 18   SpO2 100%  ? ?Visual Acuity ?Right Eye Distance:   ?Left Eye Distance:   ?Bilateral Distance:   ? ?Right Eye Near:   ?Left Eye Near:    ?Bilateral Near:    ? ?Physical Exam ?Vitals and nursing note reviewed.  ?Constitutional:   ?   General: She is awake. She is not in acute distress. ?   Appearance: She is well-developed and well-groomed. She is ill-appearing.  ?   Comments: She is sitting on the exam table in no acute distress but appears tired and ill.   ?HENT:  ?   Head: Normocephalic and atraumatic.  ?   Right Ear: Hearing, tympanic membrane, ear canal and external ear normal.  ?   Left Ear: Hearing, tympanic membrane, ear canal and external ear normal.  ?   Nose: Rhinorrhea present. Rhinorrhea is clear.  ?   Right Sinus: No maxillary sinus tenderness or frontal sinus tenderness.  ?   Left Sinus: No maxillary sinus tenderness or frontal sinus tenderness.  ?   Mouth/Throat:  ?   Lips: Pink.  ?   Mouth: Mucous membranes are moist.  ?   Pharynx: Uvula midline. Oropharyngeal exudate (clear to yellow post nasal drainage present) and posterior oropharyngeal erythema present. No  pharyngeal swelling or uvula swelling.  ?Eyes:  ?   Extraocular Movements: Extraocular movements intact.  ?   Conjunctiva/sclera: Conjunctivae normal.  ?Cardiovascular:  ?   Rate and Rhythm: Normal rate and regular rhythm.  ?   Heart sounds

## 2022-07-18 ENCOUNTER — Other Ambulatory Visit: Payer: Self-pay | Admitting: Internal Medicine

## 2022-07-18 DIAGNOSIS — Z1231 Encounter for screening mammogram for malignant neoplasm of breast: Secondary | ICD-10-CM

## 2022-08-16 ENCOUNTER — Ambulatory Visit
Admission: RE | Admit: 2022-08-16 | Discharge: 2022-08-16 | Disposition: A | Discharge status: Home / Self Care | Payer: Medicare Other | Source: Ambulatory Visit | Attending: Internal Medicine | Admitting: Internal Medicine

## 2022-08-16 DIAGNOSIS — Z1231 Encounter for screening mammogram for malignant neoplasm of breast: Secondary | ICD-10-CM | POA: Insufficient documentation

## 2022-08-21 ENCOUNTER — Other Ambulatory Visit: Payer: Self-pay | Admitting: Internal Medicine

## 2022-08-21 DIAGNOSIS — R928 Other abnormal and inconclusive findings on diagnostic imaging of breast: Secondary | ICD-10-CM

## 2022-08-21 DIAGNOSIS — N63 Unspecified lump in unspecified breast: Secondary | ICD-10-CM

## 2022-08-22 ENCOUNTER — Ambulatory Visit
Admission: RE | Admit: 2022-08-22 | Discharge: 2022-08-22 | Disposition: A | Discharge status: Home / Self Care | Payer: Medicare Other | Source: Ambulatory Visit | Attending: Internal Medicine | Admitting: Internal Medicine

## 2022-08-22 DIAGNOSIS — R928 Other abnormal and inconclusive findings on diagnostic imaging of breast: Secondary | ICD-10-CM | POA: Insufficient documentation

## 2022-08-22 DIAGNOSIS — N63 Unspecified lump in unspecified breast: Secondary | ICD-10-CM | POA: Insufficient documentation

## 2022-09-03 ENCOUNTER — Other Ambulatory Visit: Payer: Medicare Other

## 2022-11-04 ENCOUNTER — Encounter: Payer: Self-pay | Admitting: Nurse Practitioner

## 2022-11-04 ENCOUNTER — Inpatient Hospital Stay: Payer: Medicare Other | Attending: Nurse Practitioner | Admitting: Nurse Practitioner

## 2022-11-04 VITALS — BP 113/66 | HR 67 | Temp 98.0°F | Wt 117.0 lb

## 2022-11-04 DIAGNOSIS — Z08 Encounter for follow-up examination after completed treatment for malignant neoplasm: Secondary | ICD-10-CM | POA: Diagnosis not present

## 2022-11-04 DIAGNOSIS — Z853 Personal history of malignant neoplasm of breast: Secondary | ICD-10-CM | POA: Diagnosis not present

## 2022-11-04 DIAGNOSIS — Z79811 Long term (current) use of aromatase inhibitors: Secondary | ICD-10-CM | POA: Insufficient documentation

## 2022-11-04 DIAGNOSIS — C50312 Malignant neoplasm of lower-inner quadrant of left female breast: Secondary | ICD-10-CM | POA: Insufficient documentation

## 2022-11-04 DIAGNOSIS — M8580 Other specified disorders of bone density and structure, unspecified site: Secondary | ICD-10-CM | POA: Insufficient documentation

## 2022-11-04 DIAGNOSIS — Z170 Estrogen receptor positive status [ER+]: Secondary | ICD-10-CM | POA: Insufficient documentation

## 2022-11-04 NOTE — Progress Notes (Signed)
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  INTERVAL HISTORY: Patient is 66 year old female with history of breast cancer who returns to clinic for annual evaluation. She has chronic aches and pains of legs, hips, back and is followed by physiatry. She denies breast complaints. Symptoms didn't improve with completing letrozole. No falls or fractures. Appetite is good and she denies unintentional weight loss. No abdominal pain, nausea, vomiting.   REVIEW OF SYSTEMS:   Review of Systems  Constitutional: Negative.  Negative for fever, malaise/fatigue and weight loss.  Respiratory: Negative.  Negative for cough and shortness of breath.   Cardiovascular: Negative.  Negative for chest pain and leg swelling.  Gastrointestinal: Negative.  Negative for abdominal pain.  Genitourinary: Negative.  Negative for dysuria.  Musculoskeletal: Negative.  Negative for back pain.  Skin: Negative.  Negative for rash.  Neurological: Negative.  Negative for sensory change, weakness and headaches.  Psychiatric/Behavioral: Negative.  The patient is not nervous/anxious.   As per HPI. Otherwise, a complete review of systems is negative.  ONCOLOGY HISTORY: Oncology History  Malignant neoplasm of left female breast ([place])  06/02/2015 Initial Diagnosis   Breast cancer of lower-inner quadrant of left female breast ([place]), T1 N0 M0, ER positive, PR negative, Her 2 negative   06/05/2015 Surgery   lumpectomy and SLN biopsy    - 10/20/2015 Radiation Therapy   XRT   10/20/2015 -  Anti-estrogen oral therapy   started on Letrozole     PAST  MEDICAL HISTORY: Past Medical History:  Diagnosis Date   Arthritis    RIGHT LEG   BRCA negative 06-22-15   Dr Bary Castilla   BRCA negative 2016   Cancer of breast ([place]) 2016-Left   INVASIVE LOBULAR CARCINOMA, T1b,N0. ER positive, PR negative, HER-2/neu not overexpressed. Lumpectomy c Radiation   Family history of adverse reaction to anesthesia    SISTER-TROUBLE WAKING UP   Hypercholesteremia    Melanoma ([place])    Personal history of radiation therapy 10/20/2015   left breast ca    PAST SURGICAL HISTORY: Past Surgical History:  Procedure Laterality Date   ABDOMINAL HYSTERECTOMY  1990   BACK SURGERY     LUMBAR-HERNIATED [place]   BREAST BIOPSY Left 06/16/2015   fibroadenoma and ADH 11:00 position   BREAST BIOPSY Left 06/16/2015   INVASIVE LOBULAR CARCINOMA and invasive lobular insitu at 7:30 position   BREAST LUMPECTOMY Left 07/12/2015   clear margins for removal of ADH and invasive lobular carcinoma. Lymph nodes neagive   BREAST LUMPECTOMY WITH SENTINEL LYMPH NODE BIOPSY Left 07/12/2015   Procedure: BREAST WIDE EXCISION WITH SENTINEL LYMPH NODE BX, MASTOPLASTY ;  Surgeon: [name], MD;  Location: [facility] ORS;  Service: General;  Laterality: Left;   BREAST SURGERY Left 07/13/2015    Wide excision, mastoplasty, sentinel node biopsy.   BUNIONECTOMY Bilateral    COLONOSCOPY  2012   [facility]   LEG SKIN LESION  BIOPSY / EXCISION Left 2012,2015   melanoma    FAMILY HISTORY Family History  Problem Relation Age of Onset   Breast cancer Sister 70       2016   Breast cancer  Paternal Aunt 8   Hypertension Mother    Diabetes Father    Cancer Maternal Aunt 63       ovarian    GYNECOLOGIC HISTORY:  No LMP recorded. Patient has had a hysterectomy.     ADVANCED DIRECTIVES:    HEALTH MAINTENANCE: Social History   Tobacco Use   Smoking status: Never   Smokeless tobacco: Never  Vaping Use   Vaping Use: Never used  Substance Use Topics   Alcohol use: Yes    Alcohol/week: 0.0  standard drinks of alcohol    Comment: OCC   Drug use: No    Colonoscopy: 2018; next 2028  Bone density: per A&P  Mammogram: per A&P  Allergies  Allergen Reactions   Duloxetine Nausea Only    Dizziness, blurred vision    Current Outpatient Medications  Medication Sig Dispense Refill   benzonatate (TESSALON PERLES) 100 MG capsule Take 1 to 2 capsules by mouth every 8 hours as needed for cough 21 capsule 0   Calcium-Magnesium-Vitamin D (CALCIUM 500 PO) Take 2 tablets by mouth daily.     docusate sodium (COLACE) 50 MG capsule Take by mouth.     gabapentin (NEURONTIN) 300 MG capsule 1 po tid     ibuprofen (ADVIL,MOTRIN) 200 MG tablet Take 200 mg by mouth every 6 (six) hours as needed.     ipratropium (ATROVENT) 0.06 % nasal spray Place 2 sprays into both nostrils 4 (four) times daily. 15 mL 12   letrozole (FEMARA) 2.5 MG tablet TAKE 1 TABLET EVERY DAY 90 tablet 1   traZODone (DESYREL) 50 MG tablet Take 50 mg by mouth at bedtime.     Vitamin D, Ergocalciferol, (DRISDOL) 50000 UNITS CAPS capsule Take 50,000 Units by mouth every 7 (seven) days.      vitamin E 400 UNIT capsule Take 400 Units by mouth 2 (two) times daily.     dicyclomine (BENTYL) 20 MG tablet Take by mouth.     No current facility-administered medications for this visit.    OBJECTIVE: BP 113/66 (BP Location: Left Arm, Patient Position: Sitting)   Pulse 67   Temp 98 F (36.7 C) (Tympanic)   Wt 117 lb (53.1 kg)   BMI 22.85 kg/m    Body mass index is 22.85 kg/m.    ECOG FS:0 - Asymptomatic  General: Well-developed, well-nourished, no acute distress. HEENT: Normocephalic. Neuro: Alert, answering all questions appropriately. Cranial nerves grossly intact. Psych: Normal affect.   LAB RESULTS:  No visits with results within 3 Day(s) from this visit.  Latest known visit with results is:  Admission on 04/10/2022, Discharged on 04/10/2022  Component Date Value Ref Range Status   Group A Strep by PCR 04/10/2022 NOT  DETECTED  NOT DETECTED Final   Performed at [facility], [address], [place], [place] [address]   SARS Coronavirus 2 by RT PCR 04/10/2022 NEGATIVE  NEGATIVE Final   Comment: (NOTE) SARS-CoV-2 target nucleic acids are NOT DETECTED.  The SARS-CoV-2 RNA is generally detectable in upper respiratory specimens during the acute phase of infection. The lowest concentration of SARS-CoV-2 viral copies this assay can detect is 138 copies/mL. A negative result does not preclude SARS-Cov-2 infection and should not be used as the sole basis for treatment or other patient management decisions. A negative result may occur with  improper specimen collection/handling, submission of specimen other than nasopharyngeal swab, presence of viral mutation(s) within the areas targeted by this assay, and inadequate number of viral copies(<138  copies/mL). A negative result must be combined with clinical observations, patient history, and epidemiological information. The expected result is Negative.  Fact Sheet for Patients:  EntrepreneurPulse.com.au  Fact Sheet for Healthcare Providers:  IncredibleEmployment.be  This test is no                          t yet approved or cleared by the [place] FDA and  has been authorized for detection and/or diagnosis of SARS-CoV-2 by FDA under an Emergency Use Authorization (EUA). This EUA will remain  in effect (meaning this test can be used) for the duration of the COVID-19 declaration under Section 564(b)(1) of the Act, 21 U.S.C.section 360bbb-3(b)(1), unless the authorization is terminated  or revoked sooner.       Influenza A by PCR 04/10/2022 NEGATIVE  NEGATIVE Final   Influenza B by PCR 04/10/2022 NEGATIVE  NEGATIVE Final   Comment: (NOTE) The Xpert Xpress SARS-CoV-2/FLU/RSV plus assay is intended as an aid in the diagnosis of influenza from Nasopharyngeal swab specimens and should not be used as a sole  basis for treatment. Nasal washings and aspirates are unacceptable for Xpert Xpress SARS-CoV-2/FLU/RSV testing.  Fact Sheet for Patients: EntrepreneurPulse.com.au  Fact Sheet for Healthcare Providers: IncredibleEmployment.be  This test is not yet approved or cleared by the [place] FDA and has been authorized for detection and/or diagnosis of SARS-CoV-2 by FDA under an Emergency Use Authorization (EUA). This EUA will remain in effect (meaning this test can be used) for the duration of the COVID-19 declaration under Section 564(b)(1) of the Act, 21 U.S.C. section 360bbb-3(b)(1), unless the authorization is terminated or revoked.  Performed at [facility], [address] [place], [place] [address]     STUDIES: No results found.  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   PLAN:    1. 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 She did not require chemotherapy. She completed 5 years of adjuvant letrozole in November 2021. NED since. Her most recent mammogram August 19, 2022 reported as bi-rads 0 and followed up with diagnostic right mammogram on 08/22/22 with benign appearing findings. No evidence of malignancy in left breast. We reviewed NCCN guidelines including annual mammogram. She wishes to follow up with PCP and can return to cancer center as needed.   2. Osteopenia: Patient's most recent bone mineral density on November 08, 2020 reported T score of -2.0 which is essentially unchanged from 1 year prior where her T score was reported -2.1. She can follow up with pcp for ongoing bone density screenings. Continue calcium and vitamin D supplementation.    Disposition: Follow up with PCP and return to cancer center as  needed.   Patient expressed understanding and was in agreement with this plan. She also understands that She can call clinic at any time with any questions, concerns, or complaints.   Breast cancer of lower-inner quadrant of left female breast [facility])   Staging form: Breast, AJCC 7th Edition     Clinical stage from 06/02/2015: Stage IA (T1, N0, M0) - Signed by Evlyn Kanner, NP on 04/18/2016  Verlon Au, NP  11/04/2022

## 2023-01-21 ENCOUNTER — Other Ambulatory Visit: Payer: Self-pay | Admitting: Internal Medicine

## 2023-01-21 DIAGNOSIS — M4807 Spinal stenosis, lumbosacral region: Secondary | ICD-10-CM

## 2023-01-21 DIAGNOSIS — Z853 Personal history of malignant neoplasm of breast: Secondary | ICD-10-CM

## 2023-01-24 ENCOUNTER — Ambulatory Visit
Admission: RE | Admit: 2023-01-24 | Discharge: 2023-01-24 | Disposition: A | Discharge status: Home / Self Care | Payer: Medicare Other | Source: Ambulatory Visit | Attending: Internal Medicine | Admitting: Internal Medicine

## 2023-01-24 DIAGNOSIS — Z853 Personal history of malignant neoplasm of breast: Secondary | ICD-10-CM | POA: Insufficient documentation

## 2023-01-24 DIAGNOSIS — M4807 Spinal stenosis, lumbosacral region: Secondary | ICD-10-CM | POA: Insufficient documentation

## 2023-02-28 ENCOUNTER — Other Ambulatory Visit: Payer: Self-pay | Admitting: Neurosurgery

## 2023-02-28 DIAGNOSIS — M79605 Pain in left leg: Secondary | ICD-10-CM

## 2023-03-07 ENCOUNTER — Ambulatory Visit
Admission: RE | Admit: 2023-03-07 | Discharge: 2023-03-07 | Disposition: A | Discharge status: Home / Self Care | Payer: Medicare Other | Source: Ambulatory Visit | Attending: Neurosurgery | Admitting: Neurosurgery

## 2023-03-07 DIAGNOSIS — M79605 Pain in left leg: Secondary | ICD-10-CM

## 2023-03-07 MED ORDER — DIAZEPAM 5 MG PO TABS
5.0000 mg | ORAL_TABLET | Freq: Once | ORAL | Status: AC
  Administered 2023-03-07: 5 mg via ORAL

## 2023-03-07 MED ORDER — IOPAMIDOL (ISOVUE-M 200) INJECTION 41%
20.0000 mL | Freq: Once | INTRAMUSCULAR | Status: AC
  Administered 2023-03-07: 20 mL via INTRATHECAL

## 2023-03-07 MED ORDER — ONDANSETRON HCL 4 MG/2ML IJ SOLN: 4.0000 mg | Freq: Once | INTRAMUSCULAR | Status: DC | PRN

## 2023-03-07 MED ORDER — MEPERIDINE HCL 50 MG/ML IJ SOLN: 50.0000 mg | Freq: Once | INTRAMUSCULAR | Status: DC | PRN

## 2023-03-07 NOTE — Discharge Instructions (Signed)

## 2023-08-05 ENCOUNTER — Other Ambulatory Visit: Payer: Self-pay | Admitting: Internal Medicine

## 2023-08-05 DIAGNOSIS — Z1231 Encounter for screening mammogram for malignant neoplasm of breast: Secondary | ICD-10-CM

## 2023-08-22 ENCOUNTER — Ambulatory Visit
Admission: RE | Admit: 2023-08-22 | Discharge: 2023-08-22 | Disposition: A | Discharge status: Home / Self Care | Payer: Medicare Other | Source: Ambulatory Visit | Attending: Internal Medicine | Admitting: Internal Medicine

## 2023-08-22 DIAGNOSIS — Z1231 Encounter for screening mammogram for malignant neoplasm of breast: Secondary | ICD-10-CM | POA: Diagnosis present

## 2024-05-05 ENCOUNTER — Other Ambulatory Visit: Payer: Self-pay | Admitting: Internal Medicine

## 2024-05-05 DIAGNOSIS — Z1231 Encounter for screening mammogram for malignant neoplasm of breast: Secondary | ICD-10-CM

## 2024-05-05 DIAGNOSIS — R413 Other amnesia: Secondary | ICD-10-CM

## 2024-05-08 ENCOUNTER — Ambulatory Visit
Admission: RE | Admit: 2024-05-08 | Discharge: 2024-05-08 | Disposition: A | Discharge status: Home / Self Care | Source: Ambulatory Visit | Attending: Internal Medicine | Admitting: Internal Medicine

## 2024-05-08 DIAGNOSIS — R413 Other amnesia: Secondary | ICD-10-CM | POA: Insufficient documentation

## 2024-08-27 ENCOUNTER — Encounter

## 2024-08-27 ENCOUNTER — Ambulatory Visit
Admission: RE | Admit: 2024-08-27 | Discharge: 2024-08-27 | Disposition: A | Discharge status: Home / Self Care | Source: Ambulatory Visit | Attending: Internal Medicine | Admitting: Internal Medicine

## 2024-08-27 DIAGNOSIS — Z1231 Encounter for screening mammogram for malignant neoplasm of breast: Secondary | ICD-10-CM | POA: Diagnosis present

## 2024-08-31 ENCOUNTER — Other Ambulatory Visit: Payer: Self-pay | Admitting: Physical Medicine & Rehabilitation

## 2024-08-31 DIAGNOSIS — M5416 Radiculopathy, lumbar region: Secondary | ICD-10-CM

## 2024-09-02 ENCOUNTER — Ambulatory Visit
Admission: RE | Admit: 2024-09-02 | Discharge: 2024-09-02 | Disposition: A | Discharge status: Home / Self Care | Source: Ambulatory Visit | Attending: Physical Medicine & Rehabilitation | Admitting: Physical Medicine & Rehabilitation

## 2024-09-02 DIAGNOSIS — M5416 Radiculopathy, lumbar region: Secondary | ICD-10-CM

## 2024-11-12 NOTE — Progress Notes (Deleted)
 Referring Physician:  Dodson Delon FERNS, MD [address] [place],  [place] [address]  Primary Physician:  Auston Reyes BIRCH, MD  History of Present Illness: 11/12/2024 Ms. Lauren Mayo is here today with a chief complaint of ***   Back pain that radiates into the bilateral legs. Worse in the left??   Duration: *** Location: *** Quality: ***tight, burning Severity: ***  Precipitating: aggravated by ***laying down at night Modifying factors: made better by ***standing, walking Weakness: none Timing: ***constant Bowel/Bladder Dysfunction: none  Conservative measures:  Physical therapy: *** has participated in at [facility]. Has she been discharged, if not how many visits? Multimodal medical therapy including regular antiinflammatories: *** gabapentin, cymbalta, celebrex, tylenol , ibuprofen  Injections: ***  Left L4-5 and L5-S1 TFESI 05/18/2021 with 50% relief Bilateral S1 TFESI with Celestone 03/05/2024 with 60% relief Bilateral S1 TFESI with Celestone 07/09/2024 with no significant relief Bilateral S1 TFESI with dexamethasone  08/13/2024 with no significant relief   Past Surgery: *** Lumbar Surgery   Lauren Mayo has ***no symptoms of cervical myelopathy.  The symptoms are causing a significant impact on the patient's life.   I have utilized the care everywhere function in epic to review the outside records available from external health systems.   Review of Systems:  A 10 point review of systems is negative, except for the pertinent positives and negatives detailed in the HPI.  Past Medical History: Past Medical History:  Diagnosis Date   Arthritis    RIGHT LEG   BRCA negative 06-22-15   Dr Dessa   BRCA negative 2016   Cancer of breast (HCC) 2016-Left   INVASIVE LOBULAR CARCINOMA, T1b,N0. ER positive, PR negative, HER-2/neu not overexpressed. Lumpectomy c Radiation   Family history of adverse reaction to anesthesia    SISTER-TROUBLE WAKING UP    Hypercholesteremia    Melanoma (HCC)    Personal history of radiation therapy 10/20/2015   left breast ca    Past Surgical History: Past Surgical History:  Procedure Laterality Date   ABDOMINAL HYSTERECTOMY  1990   BACK SURGERY     LUMBAR-HERNIATED DISC   BREAST BIOPSY Left 06/16/2015   fibroadenoma and ADH 11:00 position   BREAST BIOPSY Left 06/16/2015   INVASIVE LOBULAR CARCINOMA and invasive lobular insitu at 7:30 position   BREAST LUMPECTOMY Left 07/12/2015   clear margins for removal of ADH and invasive lobular carcinoma. Lymph nodes neagive   BREAST LUMPECTOMY WITH SENTINEL LYMPH NODE BIOPSY Left 07/12/2015   Procedure: BREAST WIDE EXCISION WITH SENTINEL LYMPH NODE BX, MASTOPLASTY ;  Surgeon: Reyes LELON Dessa, MD;  Location: [facility] ORS;  Service: General;  Laterality: Left;   BREAST SURGERY Left 07/13/2015    Wide excision, mastoplasty, sentinel node biopsy.   BUNIONECTOMY Bilateral    COLONOSCOPY  2012   [facility]   LEG SKIN LESION  BIOPSY / EXCISION Left 2012,2015   melanoma    Allergies: Allergies as of 11/16/2024   (No Active Allergies)    Medications: Current Medications[1]  Social History: Social History[2]  Family Medical History: Family History  Problem Relation Age of Onset   Breast cancer Sister 49       2016   Breast cancer Paternal Aunt 40   Hypertension Mother    Diabetes Father    Cancer Maternal Aunt 65       ovarian    Physical Examination: There were no vitals filed for this visit.  General: Patient is in no apparent distress. Attention to examination  is appropriate.  Neck:   Supple.  Full range of motion.  Respiratory: Patient is breathing without any difficulty.   NEUROLOGICAL:     Awake, alert, oriented to person, place, and time.  Speech is clear and fluent.   Cranial Nerves: Pupils equal round and reactive to light.  Facial tone is symmetric.  Facial sensation is symmetric. Shoulder shrug is symmetric. Tongue protrusion is  midline.  There is no pronator drift.  Strength: Side Biceps Triceps Deltoid Interossei Grip Wrist Ext. Wrist Flex.  R 5 5 5 5 5 5 5   L 5 5 5 5 5 5 5    Side Iliopsoas Quads Hamstring PF DF EHL  R 5 5 5 5 5 5   L 5 5 5 5 5 5    Reflexes are ***2+ and symmetric at the biceps, triceps, brachioradialis, patella and achilles.   Hoffman's is absent.   Bilateral upper and lower extremity sensation is intact to light touch.    No evidence of dysmetria noted.  Gait is normal.     Medical Decision Making  Imaging: ***  I have personally reviewed the images and agree with the above interpretation.  Assessment and Plan: Ms. Lauren Mayo is a pleasant 68 y.o. female with ***      Thank you for involving me in the care of this patient.      Chester K. Clois MD, [facility] Neurosurgery     [1]  Current Outpatient Medications:    benzonatate  (TESSALON  PERLES) 100 MG capsule, Take 1 to 2 capsules by mouth every 8 hours as needed for cough, Disp: 21 capsule, Rfl: 0   Calcium-Magnesium-Vitamin D (CALCIUM 500 PO), Take 2 tablets by mouth daily., Disp: , Rfl:    dicyclomine (BENTYL) 20 MG tablet, Take by mouth., Disp: , Rfl:    docusate sodium (COLACE) 50 MG capsule, Take by mouth., Disp: , Rfl:    gabapentin (NEURONTIN) 300 MG capsule, 1 po tid, Disp: , Rfl:    ibuprofen (ADVIL,MOTRIN) 200 MG tablet, Take 200 mg by mouth every 6 (six) hours as needed., Disp: , Rfl:    ipratropium (ATROVENT ) 0.06 % nasal spray, Place 2 sprays into both nostrils 4 (four) times daily., Disp: 15 mL, Rfl: 12   letrozole  (FEMARA ) 2.5 MG tablet, TAKE 1 TABLET EVERY DAY, Disp: 90 tablet, Rfl: 1   traZODone (DESYREL) 50 MG tablet, Take 50 mg by mouth at bedtime., Disp: , Rfl:    Vitamin D, Ergocalciferol, (DRISDOL) 50000 UNITS CAPS capsule, Take 50,000 Units by mouth every 7 (seven) days. , Disp: , Rfl:    vitamin E 400 UNIT capsule, Take 400 Units by mouth 2 (two) times daily., Disp: , Rfl:  [2]  Social  History Tobacco Use   Smoking status: Never   Smokeless tobacco: Never  Vaping Use   Vaping status: Never Used  Substance Use Topics   Alcohol use: Yes    Alcohol/week: 0.0 standard drinks of alcohol    Comment: OCC   Drug use: No

## 2024-11-16 ENCOUNTER — Ambulatory Visit: Admitting: Neurosurgery
